# Patient Record
Sex: Male | Born: 1950
Health system: Southern US, Community
[De-identification: ages and names within clinical notes are randomized; demographics above are authoritative.]

## PROBLEM LIST (undated history)

## (undated) DIAGNOSIS — I509 Heart failure, unspecified: Secondary | ICD-10-CM

## (undated) DIAGNOSIS — J449 Chronic obstructive pulmonary disease, unspecified: Secondary | ICD-10-CM

## (undated) DIAGNOSIS — J189 Pneumonia, unspecified organism: Secondary | ICD-10-CM

## (undated) DIAGNOSIS — J939 Pneumothorax, unspecified: Secondary | ICD-10-CM

## (undated) DIAGNOSIS — Z87442 Personal history of urinary calculi: Secondary | ICD-10-CM

## (undated) DIAGNOSIS — R0600 Dyspnea, unspecified: Secondary | ICD-10-CM

## (undated) HISTORY — DX: Chronic obstructive pulmonary disease, unspecified: J44.9

## (undated) HISTORY — DX: Personal history of urinary calculi: Z87.442

## (undated) HISTORY — DX: Pneumothorax, unspecified: J93.9

---

## 2010-08-13 ENCOUNTER — Emergency Department (HOSPITAL_COMMUNITY): Payer: Self-pay

## 2010-08-13 ENCOUNTER — Inpatient Hospital Stay (HOSPITAL_COMMUNITY)
Admission: EM | Admit: 2010-08-13 | Discharge: 2010-08-16 | DRG: 201 | Disposition: A | Discharge status: Home / Self Care | Payer: Self-pay | Attending: Surgery | Admitting: Surgery

## 2010-08-13 DIAGNOSIS — Z7982 Long term (current) use of aspirin: Secondary | ICD-10-CM

## 2010-08-13 DIAGNOSIS — J4489 Other specified chronic obstructive pulmonary disease: Secondary | ICD-10-CM | POA: Diagnosis present

## 2010-08-13 DIAGNOSIS — F17200 Nicotine dependence, unspecified, uncomplicated: Secondary | ICD-10-CM | POA: Diagnosis present

## 2010-08-13 DIAGNOSIS — J449 Chronic obstructive pulmonary disease, unspecified: Secondary | ICD-10-CM | POA: Diagnosis present

## 2010-08-13 DIAGNOSIS — J939 Pneumothorax, unspecified: Secondary | ICD-10-CM

## 2010-08-13 DIAGNOSIS — J9311 Primary spontaneous pneumothorax: Secondary | ICD-10-CM

## 2010-08-13 DIAGNOSIS — J9383 Other pneumothorax: Principal | ICD-10-CM | POA: Diagnosis present

## 2010-08-13 HISTORY — PX: CHEST TUBE INSERTION: SHX231

## 2010-08-13 HISTORY — DX: Pneumothorax, unspecified: J93.9

## 2010-08-13 LAB — DIFFERENTIAL
Lymphocytes Relative: 25 % (ref 12–46)
Monocytes Absolute: 0.9 10*3/uL (ref 0.1–1.0)
Monocytes Relative: 6 % (ref 3–12)
Neutro Abs: 9.9 10*3/uL — ABNORMAL HIGH (ref 1.7–7.7)

## 2010-08-13 LAB — CBC
HCT: 49.1 % (ref 39.0–52.0)
Hemoglobin: 16.1 g/dL (ref 13.0–17.0)
MCH: 29.3 pg (ref 26.0–34.0)
MCHC: 32.8 g/dL (ref 30.0–36.0)

## 2010-08-13 LAB — BASIC METABOLIC PANEL
CO2: 23 mEq/L (ref 19–32)
GFR calc non Af Amer: >60 mL/min (ref >60)
Glucose, Bld: 108 mg/dL — ABNORMAL HIGH (ref 70–99)
Potassium: 4 mEq/L (ref 3.5–5.1)
Sodium: 138 mEq/L (ref 135–145)

## 2010-08-14 ENCOUNTER — Inpatient Hospital Stay (HOSPITAL_COMMUNITY): Payer: Self-pay

## 2010-08-15 ENCOUNTER — Inpatient Hospital Stay (HOSPITAL_COMMUNITY): Payer: Self-pay

## 2010-08-16 ENCOUNTER — Inpatient Hospital Stay (HOSPITAL_COMMUNITY): Payer: Self-pay

## 2010-08-22 ENCOUNTER — Encounter (INDEPENDENT_AMBULATORY_CARE_PROVIDER_SITE_OTHER): Payer: Self-pay

## 2010-08-22 DIAGNOSIS — J9311 Primary spontaneous pneumothorax: Secondary | ICD-10-CM

## 2010-09-01 DIAGNOSIS — J9383 Other pneumothorax: Secondary | ICD-10-CM

## 2010-09-01 DIAGNOSIS — J449 Chronic obstructive pulmonary disease, unspecified: Secondary | ICD-10-CM

## 2010-09-01 DIAGNOSIS — Z87442 Personal history of urinary calculi: Secondary | ICD-10-CM | POA: Insufficient documentation

## 2010-09-05 ENCOUNTER — Other Ambulatory Visit: Payer: Self-pay | Admitting: Surgery

## 2010-09-05 DIAGNOSIS — J930 Spontaneous tension pneumothorax: Secondary | ICD-10-CM

## 2010-09-05 NOTE — Op Note (Signed)
  [name], Victor Cox               ACCOUNT NO.:  [id number]  MEDICAL RECORD NO.:  [id number]  LOCATION:  2011                         FACILITY:  [facility]  PHYSICIAN:  Evelene Croon, M.D.     DATE OF BIRTH:  [birth year]  DATE OF PROCEDURE:  08/13/2010 DATE OF DISCHARGE:                              OPERATIVE REPORT   PREOPERATIVE DIAGNOSIS:  Spontaneous right pneumothorax.  POSTOPERATIVE DIAGNOSIS:  Spontaneous right pneumothorax.  PROCEDURE:  Insertion of right chest tube.  SURGEON:  Evelene Croon, MD  ANESTHESIA:  1% lidocaine local with intravenous sedation.  CLINICAL HISTORY:  This patient is a 60 year old gentleman with heavy smoking history who presents with a large right pneumothorax.  This requires chest tube insertion.  I discussed the operative procedure with the patient and his family including alternatives, benefits, and risks including but not limited to bleeding, infection, injury to the lung, failure to re-expand the lung, and need for further surgery to re-expand the lung or to seal off a persistent air leak.  He understands all this and agrees to proceed.  OPERATIVE PROCEDURE:  Informed consent was obtained from the patient. The procedure was performed in the bed in the emergency room bay.  The right side of the chest was prepped with Betadine solution and draped in usual sterile manner.  Time-out was taken with a nurse and proper patient, proper operation, proper operative side were confirmed.  Then, the patient was given 4 mg of intravenous morphine sulfate.  He was continuously monitored with blood pressure monitoring, pulse oximetry and pulse monitoring.  Then, 1% lidocaine local anesthesia was used to anesthetize the skin and subcutaneous tissue in the right anterolateral chest wall and the anterior axillary line just below the pectoralis muscle.  The anesthesia was infiltrated down to the intercostal space and the pleural space was entered with the needle,  confirming return of air.  Then, a 1-cm incision was made in this location and bluntly dissected down to the intercostal muscle.  The hemostat was used to pop through into the pleural space and there was a large rush of air.  Then, a 20-French trocar chest tube was inserted into the pleural space and trocar slightly withdrawn.  Tube was advanced up to the apex and posteriorly.  The tube was then fixed to the skin with a silk suture and connected to Pleur-Evac suction.  Dry sterile dressing applied around the tube.  Suction was applied to the tube.  There was return of air and air leak seen to stop.  There was improved breath sounds on the right at the end of procedure.  Follow up chest x-ray is pending.     Evelene Croon, M.D.     BB/MEDQ  D:  08/13/2010  T:  08/13/2010  Job:  [id number]  Electronically Signed by Evelene Croon M.D. on 09/05/2010 03:57:06 PM

## 2010-09-05 NOTE — H&P (Signed)
NAMEELISHAH, ASHMORE               ACCOUNT NO.:  [id number]  MEDICAL RECORD NO.:  [id number]  LOCATION:  2011                         FACILITY:  [facility]  PHYSICIAN:  Evelene Croon, M.D.     DATE OF BIRTH:  10/19/1950  DATE OF ADMISSION:  08/13/2010 DATE OF DISCHARGE:                             HISTORY & PHYSICAL   REASON FOR ADMISSION:  Spontaneous right pneumothorax.  CLINICAL HISTORY:  This patient is a 60 year old gentleman with no significant past medical history, except for kidney stones, who has a 40+ pack-year smoking history and developed shortness of breath on Tuesday of this week.  This was not associated with an chest pain.  He did develop some cough productive of clear sputum.  His symptoms persisted all week, and this morning, seemed worse and therefore he presented to the emergency room where chest x-ray showed a large spontaneous right pneumothorax with complete collapse of right lung.  REVIEW OF SYSTEMS:  GENERAL:  He denies any fever or chills.  He denies any recent weight changes.  He has had no change in appetite.  Denies fatigue.  EYES:  Negative.  ENT:  Negative.  ENDOCRINE:  Denies diabetes and hypothyroidism.  CARDIOVASCULAR:  He denies any chest pain or pressure.  He denies orthopnea, PND, but he has had dyspnea at rest and exertional dyspnea this week.  He denies palpitations and edema. RESPIRATORY:  He has had some cough productive of clear sputum.  He has never had pneumonia.  GI:  He denies nausea or vomiting.  Denies melena and bright red blood per rectum.  GU:  Denies dysuria and hematuria. MUSCULOSKELETAL:  Denies arthralgias or myalgias.  NEUROLOGICAL:  He denies any focal weakness or numbness.  Denies dizziness or syncope.  He has never had TIA or stroke.  ALLERGIES:  None.  PSYCHIATRIC:  Negative. HEMATOLOGIC:  Negative.  PAST MEDICAL HISTORY:  Significant for kidney stones.  He denies any other medical or surgical illnesses.  Specifically, he  denies diabetes, hypertension, and hyperlipidemia.  FAMILY HISTORY:  Negative.  He denies any history of cardiac disease or lung cancer in his family.  SOCIAL HISTORY:  He is married.  He has been unemployed since 2007.  He smokes at least one pack of cigarettes per day and has for about 40 years.  He has continued to smoke about a pack a day this weak, even though he was short of breath.  He denies any drug use.  He denies alcohol abuse.  PHYSICAL EXAMINATION:  VITAL SIGNS:  He is afebrile.  Blood pressure is 133/66, pulse is 105 and regular, respiratory rate is 21 and slightly labored, oxygen saturation is 94% on 3 liters nasal cannula. HEENT:  Normocephalic and atraumatic.  Pupils are equal and reactive to light and accommodation.  Extraocular muscles are intact.  Oropharynx is clear. NECK:  Normal carotid pulses bilaterally.  There are no bruits.  There is no adenopathy or thyromegaly. CARDIAC:  Regular rate and rhythm with normal S1 and S2.  There is no murmur, rub, or gallop. LUNGS:  Revealed absent breath sounds on the right and decreased breath sounds on the left.  He has a barrel-shaped chest  consistent with COPD. ABDOMEN:  Active bowel sounds.  His abdomen is soft and nontender. There is no palpable masses or organomegaly. EXTREMITIES:  No peripheral edema.  Pedal pulses are palpable bilaterally. SKIN:  Warm and dry.  LABORATORY EXAMINATION:  Shows normal electrolytes with BUN of 13, creatinine 0.78.  His troponin was less than 0.3.  CPK was 39 with an MB of 2.3.  White blood cell count was elevated at 14.6, hemoglobin 16.1, platelet count 267,000.  IMPRESSION:  Mr. Heitman has a large spontaneous right pneumothorax with complete collapse of the right lung.  He has a heavy smoking history and chronic obstructive pulmonary disease by chest x-ray and on exam.  He will require chest tube insertion and an admission for chest tube management.  I will start him on  bronchodilators and we will also start him on intravenous antibiotics since this has been on since Tuesday and his right lung is completely collapse, increasing his risk of developing pneumonia.  He does have a mildly elevated white blood cell count, but has remained afebrile.  He is coughing up sputum.  I discussed the treatment with him and his family.  He understands and agrees to proceed.     Evelene Croon, M.D.     BB/MEDQ  D:  08/13/2010  T:  08/13/2010  Job:  [id number]  Electronically Signed by Evelene Croon M.D. on 09/05/2010 03:57:03 PM

## 2010-09-05 NOTE — Discharge Summary (Signed)
Victor Cox, Victor Cox               ACCOUNT NO.:  [id number]  MEDICAL RECORD NO.:  [id number]  LOCATION:  2011                         FACILITY:  [facility]  PHYSICIAN:  Evelene Croon, M.D.     DATE OF BIRTH:  12/13/1950  DATE OF ADMISSION:  08/13/2010 DATE OF DISCHARGE:                              DISCHARGE SUMMARY   FINAL DIAGNOSIS:  Spontaneous right pneumothorax.  SECONDARY DIAGNOSIS:  History of nephrolithiasis.  IN-HOSPITAL OPERATIONS AND PROCEDURES:  Insertion of right chest tube, 20-French done by Dr. Laneta Simmers August 13, 2010.  PATIENT'S HISTORY AND PHYSICAL AND HOSPITAL COURSE:  The patient is a 60- year-old gentleman with no significant past medical history except for kidney stones.  He has a 40+ year pack year smoking history and developed shortness of breath on Tuesday, August 09, 2010.  He did develop some cough productive of clear sputum.  His symptoms persisted all week and on August 13, 2010, symptoms increased in severity.  He presented to the emergency room, where chest x-ray obtained showed a large spontaneous right pneumothorax with complete collapse of right lung. Dr. Laneta Simmers was consulted.  Dr. Laneta Simmers saw and evaluated the patient.  He discussed with the patient placement of right chest tube as well as admission to [facility].  For further details of the patient's past medical history and physical exam, please see dictated H and P.  Dr. Laneta Simmers discussed with the patient insertion of right chest tube for a spontaneous right pneumothorax.  He discussed risks and benefits of placing this chest tube.  The patient voiced understanding and agreed to proceed.  Plan was to insert chest tube in the emergency room prior to admission.  Dr. Laneta Simmers inserted a 20-French right chest tube without difficulty on August 13, 2010.  Followup chest x-ray showed decrease in the patient's right pneumothorax with only trace amount of anterior pleural air at the base.  Following insertion  of chest tube, the patient was admitted to [facility] on August 13, 2010.  Daily chest x- rays were obtained.  Chest x-ray on August 14, 2010, showed no pneumothorax.  The patient had no air leak noted.  Chest tube was placed to water seal.  Following day August 15, 2010, chest x-ray noted to be stable with no pneumothorax.  There is no air leak noted.  Chest tube was discontinued.  Followup chest x-ray following removal of the chest is currently pending.  I will plan to obtain PMI chest x-ray in the a.m. for further followup as well.  During that time, the patient was on 2-3 L of nasal cannula.  Currently, the patient remains on 3 L nasal cannula and morphine to wean off oxygen to maintain O2 sats greater than 90% on room air.  If unable to be weaned off oxygen, the patient may require home O2.  During the patient's hospital course, vital signs followed closely.  He has remained afebrile in normal sinus rhythm.  Blood pressure stable.  He has been up ambulating well without difficulty.  He is tolerating diet well.  No nausea, vomiting noted.  All incisions are clean, dry and intact and healing well.  Lab work on March 15, 2010, showed a D-dimer of 0.64.  Sodium of 138, potassium 4.0, chloride of 105, bicarbonate 24, BUN of 13, creatinine 0.78 and glucose 108.  White blood cell count 14.6, hemoglobin 16.1, hematocrit 49.1 and platelet count of 267.  The patient is tentatively ready for discharge to home in the a.m. August 16, 2010, pending his followup PMI.  Chest x-ray remained stable without pneumothorax.  Also, we will follow up on oxygen requirement.  FOLLOWUP APPOINTMENTS:  Followup appointment has arranged with Dr. Laneta Simmers for September 06, 2010, at 3:15 p.m.  The patient will need to obtain PMI chest x-ray 45 minutes prior to this appointment.  Suture removal appointment has been made with a nurse for August 22, 2010, at 9 o'clock a.m.  ACTIVITY:  The patient is instructed no  driving, he agrees to do so, no lifting over greater than 10 pounds.  He is told to ambulate 3-4 times per day, progress as tolerated and continue his breathing exercises.  INCISIONAL CARE:  The patient is told to shower, washing his incisions using soap and water.  He is to contact the office if he develops any drainage or opening from any of his incision sites.  DIET:  The patient can diet to be low-fat, low-salt.  DISCHARGE MEDICATIONS: 1. Guaifenesin 600 mg b.i.d. p.r.n. 2. Combivent inhaler 2 puffs 4 times daily. 3. Nicotine patch 21 mg for 24 hours daily. 4. Oxycodone 5 mg 1-2 tabs q.3 h. p.r.n. pain. 5. Aspirin 325 mg daily.     Sol Blazing, PA   ______________________________ Evelene Croon, M.D.    KMD/MEDQ  D:  08/15/2010  T:  08/16/2010  Job:  [id number]  cc:   Evelene Croon, M.D.  Electronically Signed by Cameron Proud PA on 08/17/2010 09:42:34 AM Electronically Signed by Evelene Croon M.D. on 09/05/2010 03:57:01 PM

## 2010-09-06 ENCOUNTER — Ambulatory Visit (INDEPENDENT_AMBULATORY_CARE_PROVIDER_SITE_OTHER): Payer: Self-pay | Admitting: Surgery

## 2010-09-06 ENCOUNTER — Encounter: Payer: Self-pay | Admitting: Surgery

## 2010-09-06 ENCOUNTER — Ambulatory Visit
Admission: RE | Admit: 2010-09-06 | Discharge: 2010-09-06 | Disposition: A | Discharge status: Home / Self Care | Payer: Self-pay | Source: Ambulatory Visit | Attending: Surgery | Admitting: Surgery

## 2010-09-06 VITALS — BP 126/72 | HR 90 | Resp 18

## 2010-09-06 DIAGNOSIS — Z09 Encounter for follow-up examination after completed treatment for conditions other than malignant neoplasm: Secondary | ICD-10-CM

## 2010-09-06 DIAGNOSIS — Z9889 Other specified postprocedural states: Secondary | ICD-10-CM

## 2010-09-06 DIAGNOSIS — J930 Spontaneous tension pneumothorax: Secondary | ICD-10-CM

## 2010-09-06 DIAGNOSIS — J9383 Other pneumothorax: Secondary | ICD-10-CM

## 2010-09-06 DIAGNOSIS — J939 Pneumothorax, unspecified: Secondary | ICD-10-CM

## 2010-09-06 NOTE — Progress Notes (Signed)
  HPI  Patient returns for routine postoperative follow-up having undergone insertion of a right chest tube for spontaneous right pneumothorax on 08/13/2010. The patient's early postoperative recovery while in the hospital was notable for no residual air leak. Since hospital discharge the patient reports he has been feeling well. He says he has continued to abstain from smoking since hospitalization. He denies any chest pain or shortness of breath..    Current outpatient prescriptions:albuterol (PROVENTIL) 2 MG tablet, Take 2 mg by mouth 3 (three) times daily.  , Disp: , Rfl: ;  aspirin 325 MG tablet, Take 325 mg by mouth daily.  , Disp: , Rfl: ;  guaiFENesin (MUCINEX) 600 MG 12 hr tablet, Take 1,200 mg by mouth 2 (two) times daily.  , Disp: , Rfl:     Physical Exam   Diagnostic tests:  Chest x-ray today shows clear lung fields and no pleural effusions. There is no pneumothorax.  Impression:  Status post insertion of right chest tube for spontaneous right pneumothorax  Plan:  He will return to my office or the emergency room if he develops recurrent acute shortness of breath or chest pain similar to what he had before.

## 2010-09-06 NOTE — Patient Instructions (Signed)
No smoking Return to see me or go to the emergency room if you develop acute shortness of breath or chest pain.

## 2015-03-09 DIAGNOSIS — I10 Essential (primary) hypertension: Secondary | ICD-10-CM | POA: Diagnosis not present

## 2015-03-09 DIAGNOSIS — I5022 Chronic systolic (congestive) heart failure: Secondary | ICD-10-CM | POA: Diagnosis not present

## 2015-04-20 DIAGNOSIS — Z720 Tobacco use: Secondary | ICD-10-CM | POA: Diagnosis not present

## 2015-04-20 DIAGNOSIS — I5022 Chronic systolic (congestive) heart failure: Secondary | ICD-10-CM | POA: Diagnosis not present

## 2015-06-28 DIAGNOSIS — I5021 Acute systolic (congestive) heart failure: Secondary | ICD-10-CM | POA: Diagnosis not present

## 2015-06-28 DIAGNOSIS — I5022 Chronic systolic (congestive) heart failure: Secondary | ICD-10-CM | POA: Diagnosis not present

## 2015-12-27 DIAGNOSIS — Z720 Tobacco use: Secondary | ICD-10-CM | POA: Diagnosis not present

## 2015-12-27 DIAGNOSIS — I5022 Chronic systolic (congestive) heart failure: Secondary | ICD-10-CM | POA: Diagnosis not present

## 2016-06-28 DIAGNOSIS — I517 Cardiomegaly: Secondary | ICD-10-CM | POA: Diagnosis not present

## 2016-06-28 DIAGNOSIS — I5022 Chronic systolic (congestive) heart failure: Secondary | ICD-10-CM | POA: Diagnosis not present

## 2016-06-28 DIAGNOSIS — I080 Rheumatic disorders of both mitral and aortic valves: Secondary | ICD-10-CM | POA: Diagnosis not present

## 2017-01-20 DIAGNOSIS — J441 Chronic obstructive pulmonary disease with (acute) exacerbation: Secondary | ICD-10-CM | POA: Diagnosis not present

## 2017-01-20 DIAGNOSIS — I110 Hypertensive heart disease with heart failure: Secondary | ICD-10-CM | POA: Diagnosis not present

## 2017-01-20 DIAGNOSIS — I429 Cardiomyopathy, unspecified: Secondary | ICD-10-CM | POA: Diagnosis not present

## 2017-01-20 DIAGNOSIS — I5022 Chronic systolic (congestive) heart failure: Secondary | ICD-10-CM | POA: Diagnosis not present

## 2017-01-20 DIAGNOSIS — R0602 Shortness of breath: Secondary | ICD-10-CM | POA: Diagnosis not present

## 2017-01-20 DIAGNOSIS — F17200 Nicotine dependence, unspecified, uncomplicated: Secondary | ICD-10-CM | POA: Diagnosis not present

## 2017-01-20 DIAGNOSIS — J449 Chronic obstructive pulmonary disease, unspecified: Secondary | ICD-10-CM | POA: Diagnosis not present

## 2017-01-20 DIAGNOSIS — R0902 Hypoxemia: Secondary | ICD-10-CM | POA: Diagnosis not present

## 2017-01-20 DIAGNOSIS — Z720 Tobacco use: Secondary | ICD-10-CM | POA: Diagnosis not present

## 2017-01-22 DIAGNOSIS — Z720 Tobacco use: Secondary | ICD-10-CM | POA: Diagnosis not present

## 2017-01-22 DIAGNOSIS — R0602 Shortness of breath: Secondary | ICD-10-CM | POA: Diagnosis not present

## 2017-01-22 DIAGNOSIS — J441 Chronic obstructive pulmonary disease with (acute) exacerbation: Secondary | ICD-10-CM | POA: Diagnosis not present

## 2017-01-23 DIAGNOSIS — I6523 Occlusion and stenosis of bilateral carotid arteries: Secondary | ICD-10-CM | POA: Diagnosis not present

## 2017-01-23 DIAGNOSIS — Z79899 Other long term (current) drug therapy: Secondary | ICD-10-CM | POA: Diagnosis not present

## 2017-01-23 DIAGNOSIS — J441 Chronic obstructive pulmonary disease with (acute) exacerbation: Secondary | ICD-10-CM | POA: Diagnosis present

## 2017-01-23 DIAGNOSIS — I110 Hypertensive heart disease with heart failure: Secondary | ICD-10-CM | POA: Diagnosis present

## 2017-01-23 DIAGNOSIS — R0989 Other specified symptoms and signs involving the circulatory and respiratory systems: Secondary | ICD-10-CM | POA: Diagnosis present

## 2017-01-23 DIAGNOSIS — I429 Cardiomyopathy, unspecified: Secondary | ICD-10-CM | POA: Diagnosis present

## 2017-01-23 DIAGNOSIS — I5022 Chronic systolic (congestive) heart failure: Secondary | ICD-10-CM | POA: Diagnosis present

## 2017-01-23 DIAGNOSIS — R0902 Hypoxemia: Secondary | ICD-10-CM | POA: Diagnosis present

## 2017-01-23 DIAGNOSIS — F17200 Nicotine dependence, unspecified, uncomplicated: Secondary | ICD-10-CM | POA: Diagnosis present

## 2017-02-05 DIAGNOSIS — J449 Chronic obstructive pulmonary disease, unspecified: Secondary | ICD-10-CM | POA: Diagnosis not present

## 2017-02-05 DIAGNOSIS — I429 Cardiomyopathy, unspecified: Secondary | ICD-10-CM | POA: Diagnosis not present

## 2017-02-05 DIAGNOSIS — N200 Calculus of kidney: Secondary | ICD-10-CM | POA: Diagnosis not present

## 2017-02-05 DIAGNOSIS — K8020 Calculus of gallbladder without cholecystitis without obstruction: Secondary | ICD-10-CM | POA: Diagnosis not present

## 2017-02-05 DIAGNOSIS — Z299 Encounter for prophylactic measures, unspecified: Secondary | ICD-10-CM | POA: Diagnosis not present

## 2017-02-05 DIAGNOSIS — I10 Essential (primary) hypertension: Secondary | ICD-10-CM | POA: Diagnosis not present

## 2017-02-05 DIAGNOSIS — I6529 Occlusion and stenosis of unspecified carotid artery: Secondary | ICD-10-CM | POA: Diagnosis not present

## 2017-02-05 DIAGNOSIS — Z6829 Body mass index (BMI) 29.0-29.9, adult: Secondary | ICD-10-CM | POA: Diagnosis not present

## 2017-02-05 DIAGNOSIS — Z87891 Personal history of nicotine dependence: Secondary | ICD-10-CM | POA: Diagnosis not present

## 2017-03-09 DIAGNOSIS — Z6828 Body mass index (BMI) 28.0-28.9, adult: Secondary | ICD-10-CM | POA: Diagnosis not present

## 2017-03-09 DIAGNOSIS — Z1339 Encounter for screening examination for other mental health and behavioral disorders: Secondary | ICD-10-CM | POA: Diagnosis not present

## 2017-03-09 DIAGNOSIS — Z1331 Encounter for screening for depression: Secondary | ICD-10-CM | POA: Diagnosis not present

## 2017-03-09 DIAGNOSIS — Z125 Encounter for screening for malignant neoplasm of prostate: Secondary | ICD-10-CM | POA: Diagnosis not present

## 2017-03-09 DIAGNOSIS — Z7189 Other specified counseling: Secondary | ICD-10-CM | POA: Diagnosis not present

## 2017-03-09 DIAGNOSIS — I429 Cardiomyopathy, unspecified: Secondary | ICD-10-CM | POA: Diagnosis not present

## 2017-03-09 DIAGNOSIS — I10 Essential (primary) hypertension: Secondary | ICD-10-CM | POA: Diagnosis not present

## 2017-03-09 DIAGNOSIS — Z299 Encounter for prophylactic measures, unspecified: Secondary | ICD-10-CM | POA: Diagnosis not present

## 2017-03-09 DIAGNOSIS — Z0000 Encounter for general adult medical examination without abnormal findings: Secondary | ICD-10-CM | POA: Diagnosis not present

## 2017-03-09 DIAGNOSIS — J449 Chronic obstructive pulmonary disease, unspecified: Secondary | ICD-10-CM | POA: Diagnosis not present

## 2017-03-09 DIAGNOSIS — Z1211 Encounter for screening for malignant neoplasm of colon: Secondary | ICD-10-CM | POA: Diagnosis not present

## 2017-03-09 DIAGNOSIS — Z79899 Other long term (current) drug therapy: Secondary | ICD-10-CM | POA: Diagnosis not present

## 2017-03-09 DIAGNOSIS — I6529 Occlusion and stenosis of unspecified carotid artery: Secondary | ICD-10-CM | POA: Diagnosis not present

## 2017-03-14 DIAGNOSIS — R5383 Other fatigue: Secondary | ICD-10-CM | POA: Diagnosis not present

## 2017-04-12 DIAGNOSIS — R5383 Other fatigue: Secondary | ICD-10-CM | POA: Diagnosis not present

## 2017-05-07 DIAGNOSIS — I10 Essential (primary) hypertension: Secondary | ICD-10-CM | POA: Diagnosis not present

## 2017-05-07 DIAGNOSIS — Z6828 Body mass index (BMI) 28.0-28.9, adult: Secondary | ICD-10-CM | POA: Diagnosis not present

## 2017-05-07 DIAGNOSIS — Z299 Encounter for prophylactic measures, unspecified: Secondary | ICD-10-CM | POA: Diagnosis not present

## 2017-05-07 DIAGNOSIS — I429 Cardiomyopathy, unspecified: Secondary | ICD-10-CM | POA: Diagnosis not present

## 2017-05-07 DIAGNOSIS — J449 Chronic obstructive pulmonary disease, unspecified: Secondary | ICD-10-CM | POA: Diagnosis not present

## 2017-08-06 DIAGNOSIS — Z6828 Body mass index (BMI) 28.0-28.9, adult: Secondary | ICD-10-CM | POA: Diagnosis not present

## 2017-08-06 DIAGNOSIS — J449 Chronic obstructive pulmonary disease, unspecified: Secondary | ICD-10-CM | POA: Diagnosis not present

## 2017-08-06 DIAGNOSIS — I6529 Occlusion and stenosis of unspecified carotid artery: Secondary | ICD-10-CM | POA: Diagnosis not present

## 2017-08-06 DIAGNOSIS — I10 Essential (primary) hypertension: Secondary | ICD-10-CM | POA: Diagnosis not present

## 2017-08-06 DIAGNOSIS — I429 Cardiomyopathy, unspecified: Secondary | ICD-10-CM | POA: Diagnosis not present

## 2017-08-06 DIAGNOSIS — Z299 Encounter for prophylactic measures, unspecified: Secondary | ICD-10-CM | POA: Diagnosis not present

## 2017-10-09 DIAGNOSIS — I429 Cardiomyopathy, unspecified: Secondary | ICD-10-CM | POA: Diagnosis not present

## 2017-10-09 DIAGNOSIS — N529 Male erectile dysfunction, unspecified: Secondary | ICD-10-CM | POA: Diagnosis not present

## 2017-10-09 DIAGNOSIS — Z299 Encounter for prophylactic measures, unspecified: Secondary | ICD-10-CM | POA: Diagnosis not present

## 2017-10-09 DIAGNOSIS — I10 Essential (primary) hypertension: Secondary | ICD-10-CM | POA: Diagnosis not present

## 2017-10-09 DIAGNOSIS — Z6828 Body mass index (BMI) 28.0-28.9, adult: Secondary | ICD-10-CM | POA: Diagnosis not present

## 2017-10-09 DIAGNOSIS — J449 Chronic obstructive pulmonary disease, unspecified: Secondary | ICD-10-CM | POA: Diagnosis not present

## 2017-11-06 DIAGNOSIS — I10 Essential (primary) hypertension: Secondary | ICD-10-CM | POA: Diagnosis not present

## 2017-11-06 DIAGNOSIS — Z6828 Body mass index (BMI) 28.0-28.9, adult: Secondary | ICD-10-CM | POA: Diagnosis not present

## 2017-11-06 DIAGNOSIS — Z713 Dietary counseling and surveillance: Secondary | ICD-10-CM | POA: Diagnosis not present

## 2017-11-06 DIAGNOSIS — J449 Chronic obstructive pulmonary disease, unspecified: Secondary | ICD-10-CM | POA: Diagnosis not present

## 2017-11-06 DIAGNOSIS — Z299 Encounter for prophylactic measures, unspecified: Secondary | ICD-10-CM | POA: Diagnosis not present

## 2017-11-22 DIAGNOSIS — R05 Cough: Secondary | ICD-10-CM | POA: Diagnosis not present

## 2017-11-22 DIAGNOSIS — R0602 Shortness of breath: Secondary | ICD-10-CM | POA: Diagnosis not present

## 2017-12-18 DIAGNOSIS — J449 Chronic obstructive pulmonary disease, unspecified: Secondary | ICD-10-CM | POA: Diagnosis not present

## 2017-12-18 DIAGNOSIS — Z299 Encounter for prophylactic measures, unspecified: Secondary | ICD-10-CM | POA: Diagnosis not present

## 2017-12-18 DIAGNOSIS — I10 Essential (primary) hypertension: Secondary | ICD-10-CM | POA: Diagnosis not present

## 2017-12-18 DIAGNOSIS — I429 Cardiomyopathy, unspecified: Secondary | ICD-10-CM | POA: Diagnosis not present

## 2017-12-18 DIAGNOSIS — Z6838 Body mass index (BMI) 38.0-38.9, adult: Secondary | ICD-10-CM | POA: Diagnosis not present

## 2018-03-11 DIAGNOSIS — Z299 Encounter for prophylactic measures, unspecified: Secondary | ICD-10-CM | POA: Diagnosis not present

## 2018-03-11 DIAGNOSIS — Z1331 Encounter for screening for depression: Secondary | ICD-10-CM | POA: Diagnosis not present

## 2018-03-11 DIAGNOSIS — Z125 Encounter for screening for malignant neoplasm of prostate: Secondary | ICD-10-CM | POA: Diagnosis not present

## 2018-03-11 DIAGNOSIS — F17210 Nicotine dependence, cigarettes, uncomplicated: Secondary | ICD-10-CM | POA: Diagnosis not present

## 2018-03-11 DIAGNOSIS — I10 Essential (primary) hypertension: Secondary | ICD-10-CM | POA: Diagnosis not present

## 2018-03-11 DIAGNOSIS — Z7189 Other specified counseling: Secondary | ICD-10-CM | POA: Diagnosis not present

## 2018-03-11 DIAGNOSIS — I429 Cardiomyopathy, unspecified: Secondary | ICD-10-CM | POA: Diagnosis not present

## 2018-03-11 DIAGNOSIS — R5383 Other fatigue: Secondary | ICD-10-CM | POA: Diagnosis not present

## 2018-03-11 DIAGNOSIS — Z1339 Encounter for screening examination for other mental health and behavioral disorders: Secondary | ICD-10-CM | POA: Diagnosis not present

## 2018-03-11 DIAGNOSIS — Z0000 Encounter for general adult medical examination without abnormal findings: Secondary | ICD-10-CM | POA: Diagnosis not present

## 2018-03-11 DIAGNOSIS — Z6828 Body mass index (BMI) 28.0-28.9, adult: Secondary | ICD-10-CM | POA: Diagnosis not present

## 2018-03-11 DIAGNOSIS — Z79899 Other long term (current) drug therapy: Secondary | ICD-10-CM | POA: Diagnosis not present

## 2018-03-11 DIAGNOSIS — Z1211 Encounter for screening for malignant neoplasm of colon: Secondary | ICD-10-CM | POA: Diagnosis not present

## 2018-03-11 DIAGNOSIS — I6529 Occlusion and stenosis of unspecified carotid artery: Secondary | ICD-10-CM | POA: Diagnosis not present

## 2018-06-11 DIAGNOSIS — I429 Cardiomyopathy, unspecified: Secondary | ICD-10-CM | POA: Diagnosis not present

## 2018-06-11 DIAGNOSIS — Z6828 Body mass index (BMI) 28.0-28.9, adult: Secondary | ICD-10-CM | POA: Diagnosis not present

## 2018-06-11 DIAGNOSIS — J449 Chronic obstructive pulmonary disease, unspecified: Secondary | ICD-10-CM | POA: Diagnosis not present

## 2018-06-11 DIAGNOSIS — Z299 Encounter for prophylactic measures, unspecified: Secondary | ICD-10-CM | POA: Diagnosis not present

## 2018-06-11 DIAGNOSIS — I10 Essential (primary) hypertension: Secondary | ICD-10-CM | POA: Diagnosis not present

## 2018-08-05 DIAGNOSIS — Z8701 Personal history of pneumonia (recurrent): Secondary | ICD-10-CM | POA: Diagnosis not present

## 2018-08-05 DIAGNOSIS — J441 Chronic obstructive pulmonary disease with (acute) exacerbation: Secondary | ICD-10-CM | POA: Diagnosis not present

## 2018-08-05 DIAGNOSIS — Z720 Tobacco use: Secondary | ICD-10-CM | POA: Diagnosis not present

## 2018-08-05 DIAGNOSIS — J40 Bronchitis, not specified as acute or chronic: Secondary | ICD-10-CM | POA: Diagnosis not present

## 2018-08-05 DIAGNOSIS — Z20828 Contact with and (suspected) exposure to other viral communicable diseases: Secondary | ICD-10-CM | POA: Diagnosis not present

## 2018-08-05 DIAGNOSIS — R0602 Shortness of breath: Secondary | ICD-10-CM | POA: Diagnosis not present

## 2018-08-05 DIAGNOSIS — I509 Heart failure, unspecified: Secondary | ICD-10-CM | POA: Diagnosis not present

## 2018-08-05 DIAGNOSIS — F17200 Nicotine dependence, unspecified, uncomplicated: Secondary | ICD-10-CM | POA: Diagnosis not present

## 2018-08-13 DIAGNOSIS — J449 Chronic obstructive pulmonary disease, unspecified: Secondary | ICD-10-CM | POA: Diagnosis not present

## 2018-08-13 DIAGNOSIS — J069 Acute upper respiratory infection, unspecified: Secondary | ICD-10-CM | POA: Diagnosis not present

## 2018-08-13 DIAGNOSIS — H6981 Other specified disorders of Eustachian tube, right ear: Secondary | ICD-10-CM | POA: Diagnosis not present

## 2018-08-13 DIAGNOSIS — Z299 Encounter for prophylactic measures, unspecified: Secondary | ICD-10-CM | POA: Diagnosis not present

## 2018-08-13 DIAGNOSIS — R600 Localized edema: Secondary | ICD-10-CM | POA: Diagnosis not present

## 2018-08-13 DIAGNOSIS — Z6828 Body mass index (BMI) 28.0-28.9, adult: Secondary | ICD-10-CM | POA: Diagnosis not present

## 2018-09-13 DIAGNOSIS — Z299 Encounter for prophylactic measures, unspecified: Secondary | ICD-10-CM | POA: Diagnosis not present

## 2018-09-13 DIAGNOSIS — I10 Essential (primary) hypertension: Secondary | ICD-10-CM | POA: Diagnosis not present

## 2018-09-13 DIAGNOSIS — Z6826 Body mass index (BMI) 26.0-26.9, adult: Secondary | ICD-10-CM | POA: Diagnosis not present

## 2018-09-13 DIAGNOSIS — I429 Cardiomyopathy, unspecified: Secondary | ICD-10-CM | POA: Diagnosis not present

## 2018-09-13 DIAGNOSIS — J449 Chronic obstructive pulmonary disease, unspecified: Secondary | ICD-10-CM | POA: Diagnosis not present

## 2018-09-13 DIAGNOSIS — G4700 Insomnia, unspecified: Secondary | ICD-10-CM | POA: Diagnosis not present

## 2018-09-26 DIAGNOSIS — J449 Chronic obstructive pulmonary disease, unspecified: Secondary | ICD-10-CM | POA: Diagnosis not present

## 2018-09-26 DIAGNOSIS — I10 Essential (primary) hypertension: Secondary | ICD-10-CM | POA: Diagnosis not present

## 2019-01-16 DIAGNOSIS — I10 Essential (primary) hypertension: Secondary | ICD-10-CM | POA: Diagnosis not present

## 2019-01-16 DIAGNOSIS — J449 Chronic obstructive pulmonary disease, unspecified: Secondary | ICD-10-CM | POA: Diagnosis not present

## 2019-01-30 DIAGNOSIS — J449 Chronic obstructive pulmonary disease, unspecified: Secondary | ICD-10-CM | POA: Diagnosis not present

## 2019-01-30 DIAGNOSIS — I10 Essential (primary) hypertension: Secondary | ICD-10-CM | POA: Diagnosis not present

## 2019-03-06 DIAGNOSIS — I10 Essential (primary) hypertension: Secondary | ICD-10-CM | POA: Diagnosis not present

## 2019-03-06 DIAGNOSIS — J449 Chronic obstructive pulmonary disease, unspecified: Secondary | ICD-10-CM | POA: Diagnosis not present

## 2019-03-17 DIAGNOSIS — Z6826 Body mass index (BMI) 26.0-26.9, adult: Secondary | ICD-10-CM | POA: Diagnosis not present

## 2019-03-17 DIAGNOSIS — Z1331 Encounter for screening for depression: Secondary | ICD-10-CM | POA: Diagnosis not present

## 2019-03-17 DIAGNOSIS — Z0000 Encounter for general adult medical examination without abnormal findings: Secondary | ICD-10-CM | POA: Diagnosis not present

## 2019-03-17 DIAGNOSIS — Z299 Encounter for prophylactic measures, unspecified: Secondary | ICD-10-CM | POA: Diagnosis not present

## 2019-03-17 DIAGNOSIS — I10 Essential (primary) hypertension: Secondary | ICD-10-CM | POA: Diagnosis not present

## 2019-03-17 DIAGNOSIS — F17210 Nicotine dependence, cigarettes, uncomplicated: Secondary | ICD-10-CM | POA: Diagnosis not present

## 2019-03-17 DIAGNOSIS — Z125 Encounter for screening for malignant neoplasm of prostate: Secondary | ICD-10-CM | POA: Diagnosis not present

## 2019-03-17 DIAGNOSIS — R5383 Other fatigue: Secondary | ICD-10-CM | POA: Diagnosis not present

## 2019-03-17 DIAGNOSIS — Z79899 Other long term (current) drug therapy: Secondary | ICD-10-CM | POA: Diagnosis not present

## 2019-03-17 DIAGNOSIS — J449 Chronic obstructive pulmonary disease, unspecified: Secondary | ICD-10-CM | POA: Diagnosis not present

## 2019-03-17 DIAGNOSIS — Z7189 Other specified counseling: Secondary | ICD-10-CM | POA: Diagnosis not present

## 2019-03-17 DIAGNOSIS — Z1211 Encounter for screening for malignant neoplasm of colon: Secondary | ICD-10-CM | POA: Diagnosis not present

## 2019-03-17 DIAGNOSIS — Z1339 Encounter for screening examination for other mental health and behavioral disorders: Secondary | ICD-10-CM | POA: Diagnosis not present

## 2019-03-31 DIAGNOSIS — J449 Chronic obstructive pulmonary disease, unspecified: Secondary | ICD-10-CM | POA: Diagnosis not present

## 2019-03-31 DIAGNOSIS — I10 Essential (primary) hypertension: Secondary | ICD-10-CM | POA: Diagnosis not present

## 2019-04-23 DIAGNOSIS — Z23 Encounter for immunization: Secondary | ICD-10-CM | POA: Diagnosis not present

## 2019-05-07 DIAGNOSIS — I10 Essential (primary) hypertension: Secondary | ICD-10-CM | POA: Diagnosis not present

## 2019-05-07 DIAGNOSIS — J449 Chronic obstructive pulmonary disease, unspecified: Secondary | ICD-10-CM | POA: Diagnosis not present

## 2019-05-20 DIAGNOSIS — Z23 Encounter for immunization: Secondary | ICD-10-CM | POA: Diagnosis not present

## 2019-06-15 DIAGNOSIS — I10 Essential (primary) hypertension: Secondary | ICD-10-CM | POA: Diagnosis not present

## 2019-06-15 DIAGNOSIS — J449 Chronic obstructive pulmonary disease, unspecified: Secondary | ICD-10-CM | POA: Diagnosis not present

## 2019-06-25 DIAGNOSIS — D692 Other nonthrombocytopenic purpura: Secondary | ICD-10-CM | POA: Diagnosis not present

## 2019-06-25 DIAGNOSIS — J449 Chronic obstructive pulmonary disease, unspecified: Secondary | ICD-10-CM | POA: Diagnosis not present

## 2019-06-25 DIAGNOSIS — Z299 Encounter for prophylactic measures, unspecified: Secondary | ICD-10-CM | POA: Diagnosis not present

## 2019-06-25 DIAGNOSIS — I429 Cardiomyopathy, unspecified: Secondary | ICD-10-CM | POA: Diagnosis not present

## 2019-06-25 DIAGNOSIS — I10 Essential (primary) hypertension: Secondary | ICD-10-CM | POA: Diagnosis not present

## 2019-07-16 DIAGNOSIS — J449 Chronic obstructive pulmonary disease, unspecified: Secondary | ICD-10-CM | POA: Diagnosis not present

## 2019-07-16 DIAGNOSIS — I10 Essential (primary) hypertension: Secondary | ICD-10-CM | POA: Diagnosis not present

## 2019-08-15 DIAGNOSIS — I10 Essential (primary) hypertension: Secondary | ICD-10-CM | POA: Diagnosis not present

## 2019-08-15 DIAGNOSIS — J449 Chronic obstructive pulmonary disease, unspecified: Secondary | ICD-10-CM | POA: Diagnosis not present

## 2019-09-04 DIAGNOSIS — J449 Chronic obstructive pulmonary disease, unspecified: Secondary | ICD-10-CM | POA: Diagnosis not present

## 2019-09-04 DIAGNOSIS — I10 Essential (primary) hypertension: Secondary | ICD-10-CM | POA: Diagnosis not present

## 2019-10-03 DIAGNOSIS — J9611 Chronic respiratory failure with hypoxia: Secondary | ICD-10-CM | POA: Diagnosis not present

## 2019-10-03 DIAGNOSIS — I429 Cardiomyopathy, unspecified: Secondary | ICD-10-CM | POA: Diagnosis not present

## 2019-10-03 DIAGNOSIS — I10 Essential (primary) hypertension: Secondary | ICD-10-CM | POA: Diagnosis not present

## 2019-10-03 DIAGNOSIS — Z299 Encounter for prophylactic measures, unspecified: Secondary | ICD-10-CM | POA: Diagnosis not present

## 2019-10-03 DIAGNOSIS — J449 Chronic obstructive pulmonary disease, unspecified: Secondary | ICD-10-CM | POA: Diagnosis not present

## 2019-10-16 DIAGNOSIS — I10 Essential (primary) hypertension: Secondary | ICD-10-CM | POA: Diagnosis not present

## 2019-10-16 DIAGNOSIS — J449 Chronic obstructive pulmonary disease, unspecified: Secondary | ICD-10-CM | POA: Diagnosis not present

## 2019-10-20 DIAGNOSIS — I6523 Occlusion and stenosis of bilateral carotid arteries: Secondary | ICD-10-CM | POA: Diagnosis not present

## 2019-10-20 DIAGNOSIS — I6529 Occlusion and stenosis of unspecified carotid artery: Secondary | ICD-10-CM | POA: Diagnosis not present

## 2019-10-23 DIAGNOSIS — J449 Chronic obstructive pulmonary disease, unspecified: Secondary | ICD-10-CM | POA: Diagnosis not present

## 2019-10-23 DIAGNOSIS — F17210 Nicotine dependence, cigarettes, uncomplicated: Secondary | ICD-10-CM | POA: Diagnosis not present

## 2019-10-23 DIAGNOSIS — I429 Cardiomyopathy, unspecified: Secondary | ICD-10-CM | POA: Diagnosis not present

## 2019-10-23 DIAGNOSIS — I10 Essential (primary) hypertension: Secondary | ICD-10-CM | POA: Diagnosis not present

## 2019-10-23 DIAGNOSIS — I779 Disorder of arteries and arterioles, unspecified: Secondary | ICD-10-CM | POA: Diagnosis not present

## 2019-10-23 DIAGNOSIS — Z299 Encounter for prophylactic measures, unspecified: Secondary | ICD-10-CM | POA: Diagnosis not present

## 2019-11-04 ENCOUNTER — Other Ambulatory Visit: Payer: Self-pay

## 2019-11-04 DIAGNOSIS — I6523 Occlusion and stenosis of bilateral carotid arteries: Secondary | ICD-10-CM

## 2019-11-12 ENCOUNTER — Other Ambulatory Visit: Payer: Self-pay

## 2019-11-12 ENCOUNTER — Ambulatory Visit (HOSPITAL_COMMUNITY)
Admission: RE | Admit: 2019-11-12 | Discharge: 2019-11-12 | Disposition: A | Discharge status: Home / Self Care | Payer: Medicare Other | Source: Ambulatory Visit | Attending: Vascular Surgery | Admitting: Vascular Surgery

## 2019-11-12 DIAGNOSIS — I6523 Occlusion and stenosis of bilateral carotid arteries: Secondary | ICD-10-CM | POA: Diagnosis not present

## 2019-11-14 DIAGNOSIS — J449 Chronic obstructive pulmonary disease, unspecified: Secondary | ICD-10-CM | POA: Diagnosis not present

## 2019-11-14 DIAGNOSIS — I10 Essential (primary) hypertension: Secondary | ICD-10-CM | POA: Diagnosis not present

## 2019-11-17 ENCOUNTER — Encounter: Payer: Self-pay | Admitting: Vascular Surgery

## 2019-11-17 ENCOUNTER — Ambulatory Visit (INDEPENDENT_AMBULATORY_CARE_PROVIDER_SITE_OTHER): Payer: Medicare Other | Admitting: Vascular Surgery

## 2019-11-17 ENCOUNTER — Other Ambulatory Visit: Payer: Self-pay

## 2019-11-17 VITALS — BP 121/71 | HR 83 | Temp 97.5°F | Resp 18 | Ht 70.0 in | Wt 178.0 lb

## 2019-11-17 DIAGNOSIS — I6523 Occlusion and stenosis of bilateral carotid arteries: Secondary | ICD-10-CM | POA: Diagnosis not present

## 2019-11-17 NOTE — H&P (View-Only) (Signed)
[facility] [place]  Patient name: Victor Cox MRN: [id number] DOB: Aug 01, 1950 Sex: male  REASON FOR CONSULT: Evaluation critical right carotid stenosis, asymptomatic  HPI: Victor Cox is a 69 y.o. male, who is here today with his brother for discussion of carotid disease.  He was found to have a carotid bruit and underwent further evaluation.  He has ultrasound suggesting high-grade right carotid stenosis.  He is right-handed.  He has had no prior difficulty with TIA, amaurosis fugax, aphasia or stroke.  He has COPD requiring home oxygen.  No history of cardiac disease.  Past Medical History:  Diagnosis Date  . COPD (chronic obstructive pulmonary disease) (HCC)   . History of kidney stones   . Pneumothorax, right 08/13/2010   with complete collapse of the right lung    History reviewed. No pertinent family history.  SOCIAL HISTORY: Social History   Socioeconomic History  . Marital status: Married    Spouse name: Not on file  . Number of children: Not on file  . Years of education: Not on file  . Highest education level: Not on file  Occupational History  . Occupation: unemployed since 2007  Tobacco Use  . Smoking status: Current Every Day Smoker    Packs/day: 1.00    Years: 40.00    Pack years: 40.00    Types: Cigarettes  . Smokeless tobacco: Never Used  Substance and Sexual Activity  . Alcohol use: No  . Drug use: No  . Sexual activity: Not on file  Other Topics Concern  . Not on file  Social History Narrative  . Not on file   Social Determinants of Health   Financial Resource Strain:   . Difficulty of Paying Living Expenses: Not on file  Food Insecurity:   . Worried About Programme researcher, broadcasting/film/video in the Last Year: Not on file  . Ran Out of Food in the Last Year: Not on file  Transportation Needs:   . Lack of Transportation (Medical): Not on file  . Lack of Transportation (Non-Medical): Not on file   Physical Activity:   . Days of Exercise per Week: Not on file  . Minutes of Exercise per Session: Not on file  Stress:   . Feeling of Stress : Not on file  Social Connections:   . Frequency of Communication with Friends and Family: Not on file  . Frequency of Social Gatherings with Friends and Family: Not on file  . Attends Religious Services: Not on file  . Active Member of Clubs or Organizations: Not on file  . Attends Banker Meetings: Not on file  . Marital Status: Not on file  Intimate Partner Violence:   . Fear of Current or Ex-Partner: Not on file  . Emotionally Abused: Not on file  . Physically Abused: Not on file  . Sexually Abused: Not on file    No Known Allergies  Current Outpatient Medications  Medication Sig Dispense Refill  . albuterol (PROVENTIL) 2 MG tablet Take 2 mg by mouth 3 (three) times daily.      Marland Kitchen aspirin EC 81 MG tablet Take 81 mg by mouth daily. Swallow whole.    . losartan (COZAAR) 50 MG tablet Take 50 mg by mouth daily.    . metoprolol succinate (TOPROL-XL) 25 MG 24 hr tablet Take 25 mg by mouth daily.    Marland Kitchen spironolactone (ALDACTONE) 25 MG tablet Take by mouth.     No current facility-administered medications for  this visit.    REVIEW OF SYSTEMS:  [X]  denotes positive finding, [ ]  denotes negative finding Cardiac  Comments:  Chest pain or chest pressure:    Shortness of breath upon exertion: x   Short of breath when lying flat: x   Irregular heart rhythm:        Vascular    Pain in calf, thigh, or hip brought on by ambulation:    Pain in feet at night that wakes you up from your sleep:     Blood clot in your veins:    Leg swelling:         Pulmonary    Oxygen at home: x   Productive cough:  x   Wheezing:         Neurologic    Sudden weakness in arms or legs:     Sudden numbness in arms or legs:     Sudden onset of difficulty speaking or slurred speech:    Temporary loss of vision in one eye:     Problems with  dizziness:         Gastrointestinal    Blood in stool:     Vomited blood:         Genitourinary    Burning when urinating:     Blood in urine:        Psychiatric    Major depression:         Hematologic    Bleeding problems:    Problems with blood clotting too easily:        Skin    Rashes or ulcers:        Constitutional    Fever or chills:      PHYSICAL EXAM: Vitals:   11/17/19 1100  BP: 121/71  Pulse: 83  Resp: 18  Temp: (!) 97.5 F (36.4 C)  TempSrc: Other (Comment)  SpO2: 95%  Weight: 178 lb (80.7 kg)  Height: 5\' 10"  (1.778 m)    GENERAL: The patient is a well-nourished male, in no acute distress. The vital signs are documented above. CARDIOVASCULAR: Soft right carotid bruit and no bruit on the left.  2+ radial pulses bilaterally PULMONARY: There is good air exchange  ABDOMEN: Soft and non-tender  MUSCULOSKELETAL: There are no major deformities or cyanosis. NEUROLOGIC: No focal weakness or paresthesias are detected. SKIN: There are no ulcers or rashes noted. PSYCHIATRIC: The patient has a normal affect.  DATA:  Carotid duplex from [facility] on 11/12/2019 was reviewed with the patient.  This reveals critical right carotid stenosis and moderate left carotid stenosis  MEDICAL ISSUES: I discussed the significance of this with the patient and his brother.  Explained that this potentially puts him at increased risk for stroke related to his high-grade carotid stenosis.  I have recommended CT angiogram for further evaluation.  I explained that that this typically does confirm high-grade stenosis and allows to determine the extent of his carotid disease.  I explained that the this was indeed confirmed I would recommend right carotid endarterectomy for reduction of stroke risk.  I explained the procedure in detail with the patient including potential risk for stroke with surgery at 1 to 1-1/2%.  Also explained the slight risk of cranial nerve injury and  bleeding and infection.  He will undergo outpatient CT at [facility] and I will communicate with him following the CT.  If this confirms we will schedule surgery   11/14/2019, MD FACS [facility] Tel [phone] Pager [phone]

## 2019-11-17 NOTE — Progress Notes (Signed)
  [facility]  Patient name: Victor Cox MRN: [id number] DOB: 04/10/1950 Sex: male  REASON FOR CONSULT: Evaluation critical right carotid stenosis, asymptomatic  HPI: Victor Cox is a 69 y.o. male, who is here today with his brother for discussion of carotid disease.  He was found to have a carotid bruit and underwent further evaluation.  He has ultrasound suggesting high-grade right carotid stenosis.  He is right-handed.  He has had no prior difficulty with TIA, amaurosis fugax, aphasia or stroke.  He has COPD requiring home oxygen.  No history of cardiac disease.  Past Medical History:  Diagnosis Date  . COPD (chronic obstructive pulmonary disease) (HCC)   . History of kidney stones   . Pneumothorax, right 08/13/2010   with complete collapse of the right lung    History reviewed. No pertinent family history.  SOCIAL HISTORY: Social History   Socioeconomic History  . Marital status: Married    Spouse name: Not on file  . Number of children: Not on file  . Years of education: Not on file  . Highest education level: Not on file  Occupational History  . Occupation: unemployed since 2007  Tobacco Use  . Smoking status: Current Every Day Smoker    Packs/day: 1.00    Years: 40.00    Pack years: 40.00    Types: Cigarettes  . Smokeless tobacco: Never Used  Substance and Sexual Activity  . Alcohol use: No  . Drug use: No  . Sexual activity: Not on file  Other Topics Concern  . Not on file  Social History Narrative  . Not on file   Social Determinants of Health   Financial Resource Strain:   . Difficulty of Paying Living Expenses: Not on file  Food Insecurity:   . Worried About Running Out of Food in the Last Year: Not on file  . Ran Out of Food in the Last Year: Not on file  Transportation Needs:   . Lack of Transportation (Medical): Not on file  . Lack of Transportation (Non-Medical): Not on file   Physical Activity:   . Days of Exercise per Week: Not on file  . Minutes of Exercise per Session: Not on file  Stress:   . Feeling of Stress : Not on file  Social Connections:   . Frequency of Communication with Friends and Family: Not on file  . Frequency of Social Gatherings with Friends and Family: Not on file  . Attends Religious Services: Not on file  . Active Member of Clubs or Organizations: Not on file  . Attends Club or Organization Meetings: Not on file  . Marital Status: Not on file  Intimate Partner Violence:   . Fear of Current or Ex-Partner: Not on file  . Emotionally Abused: Not on file  . Physically Abused: Not on file  . Sexually Abused: Not on file    No Known Allergies  Current Outpatient Medications  Medication Sig Dispense Refill  . albuterol (PROVENTIL) 2 MG tablet Take 2 mg by mouth 3 (three) times daily.      . aspirin EC 81 MG tablet Take 81 mg by mouth daily. Swallow whole.    . losartan (COZAAR) 50 MG tablet Take 50 mg by mouth daily.    . metoprolol succinate (TOPROL-XL) 25 MG 24 hr tablet Take 25 mg by mouth daily.    . spironolactone (ALDACTONE) 25 MG tablet Take by mouth.     No current facility-administered medications for   this visit.    REVIEW OF SYSTEMS:  [X] denotes positive finding, [ ] denotes negative finding Cardiac  Comments:  Chest pain or chest pressure:    Shortness of breath upon exertion: x   Short of breath when lying flat: x   Irregular heart rhythm:        Vascular    Pain in calf, thigh, or hip brought on by ambulation:    Pain in feet at night that wakes you up from your sleep:     Blood clot in your veins:    Leg swelling:         Pulmonary    Oxygen at home: x   Productive cough:  x   Wheezing:         Neurologic    Sudden weakness in arms or legs:     Sudden numbness in arms or legs:     Sudden onset of difficulty speaking or slurred speech:    Temporary loss of vision in one eye:     Problems with  dizziness:         Gastrointestinal    Blood in stool:     Vomited blood:         Genitourinary    Burning when urinating:     Blood in urine:        Psychiatric    Major depression:         Hematologic    Bleeding problems:    Problems with blood clotting too easily:        Skin    Rashes or ulcers:        Constitutional    Fever or chills:      PHYSICAL EXAM: Vitals:   11/17/19 1100  BP: 121/71  Pulse: 83  Resp: 18  Temp: (!) 97.5 F (36.4 C)  TempSrc: Other (Comment)  SpO2: 95%  Weight: 178 lb (80.7 kg)  Height: 5' 10" (1.778 m)    GENERAL: The patient is a well-nourished male, in no acute distress. The vital signs are documented above. CARDIOVASCULAR: Soft right carotid bruit and no bruit on the left.  2+ radial pulses bilaterally PULMONARY: There is good air exchange  ABDOMEN: Soft and non-tender  MUSCULOSKELETAL: There are no major deformities or cyanosis. NEUROLOGIC: No focal weakness or paresthesias are detected. SKIN: There are no ulcers or rashes noted. PSYCHIATRIC: The patient has a normal affect.  DATA:  Carotid duplex from [facility] on 11/12/2019 was reviewed with the patient.  This reveals critical right carotid stenosis and moderate left carotid stenosis  MEDICAL ISSUES: I discussed the significance of this with the patient and his brother.  Explained that this potentially puts him at increased risk for stroke related to his high-grade carotid stenosis.  I have recommended CT angiogram for further evaluation.  I explained that that this typically does confirm high-grade stenosis and allows us to determine the extent of his carotid disease.  I explained that the this was indeed confirmed I would recommend right carotid endarterectomy for reduction of stroke risk.  I explained the procedure in detail with the patient including potential risk for stroke with surgery at 1 to 1-1/2%.  Also explained the slight risk of cranial nerve injury and  bleeding and infection.  He will undergo outpatient CT at [facility] and I will communicate with him following the CT.  If this confirms we will schedule surgery   Roshunda Keir F. Ameka Krigbaum, MD FACS [facility] Tel [phone] Pager [phone]

## 2019-11-21 ENCOUNTER — Other Ambulatory Visit: Payer: Self-pay | Admitting: *Deleted

## 2019-11-21 DIAGNOSIS — I6523 Occlusion and stenosis of bilateral carotid arteries: Secondary | ICD-10-CM

## 2019-11-26 ENCOUNTER — Other Ambulatory Visit: Payer: Self-pay

## 2019-11-26 DIAGNOSIS — I6523 Occlusion and stenosis of bilateral carotid arteries: Secondary | ICD-10-CM

## 2019-11-27 ENCOUNTER — Other Ambulatory Visit: Payer: Self-pay

## 2019-11-27 ENCOUNTER — Ambulatory Visit (HOSPITAL_COMMUNITY)
Admission: RE | Admit: 2019-11-27 | Discharge: 2019-11-27 | Disposition: A | Discharge status: Home / Self Care | Payer: Medicare Other | Source: Ambulatory Visit | Attending: Vascular Surgery | Admitting: Vascular Surgery

## 2019-11-27 DIAGNOSIS — I771 Stricture of artery: Secondary | ICD-10-CM | POA: Diagnosis not present

## 2019-11-27 DIAGNOSIS — I708 Atherosclerosis of other arteries: Secondary | ICD-10-CM | POA: Diagnosis not present

## 2019-11-27 DIAGNOSIS — I6523 Occlusion and stenosis of bilateral carotid arteries: Secondary | ICD-10-CM

## 2019-11-27 DIAGNOSIS — I672 Cerebral atherosclerosis: Secondary | ICD-10-CM | POA: Diagnosis not present

## 2019-11-27 LAB — POCT I-STAT CREATININE: Creatinine, Ser: 1.4 mg/dL — ABNORMAL HIGH (ref 0.61–1.24)

## 2019-11-27 MED ORDER — IOHEXOL 350 MG/ML SOLN
75.0000 mL | Freq: Once | INTRAVENOUS | Status: AC | PRN
  Administered 2019-11-27: 75 mL via INTRAVENOUS

## 2019-12-01 ENCOUNTER — Telehealth: Payer: Self-pay | Admitting: Vascular Surgery

## 2019-12-01 NOTE — Telephone Encounter (Signed)
I called the patient to discuss his recent CT angiogram of his neck.  I explained that this did confirm the duplex finding of critical carotid stenosis on the right with estimated 95% stenosis.  He has approximately 65% stenosis on the left carotid.  I have recommended right carotid endarterectomy for reduction of stroke risk.  I again explained the procedure including the 1 to 1/2% risk of stroke with surgery.  He wishes to proceed

## 2019-12-03 ENCOUNTER — Other Ambulatory Visit: Payer: Self-pay

## 2019-12-10 NOTE — Progress Notes (Signed)
[facility] [id number] - [place], [place] - [address] [place] [address] Phone: [phone] Fax: [phone]      Your procedure is scheduled on Tuesday 12/16/2019.  Report to [facility] Main Entrance "A" at 07:30 A.M., and check in at the Admitting office.  Call this number if you have problems the morning of surgery:  [phone]  Call [phone] if you have any questions prior to your surgery date Monday-Friday 8am-4pm    Remember:  Do not eat or drink after midnight the night before your surgery     Take these medicines the morning of surgery with A SIP OF WATER: Aspirin  Acetaminophen (Tylenol) - if needed Albuterol (Proventil) nebulizer - if needed  As of today, STOP taking any Aleve, Naproxen, Ibuprofen, Motrin, Advil, Goody's, BC's, all herbal medications, fish oil, and all vitamins.                       Do not wear jewelry            Do not wear lotions, powders, colognes, or deodorant.            Do not shave 48 hours prior to surgery.  Men may shave face and neck.            Do not bring valuables to the hospital.            [facility] is not responsible for any belongings or valuables.  Do NOT Smoke (Tobacco/Vaping) or drink Alcohol 24 hours prior to your procedure  If you use a CPAP at night, you may bring all equipment for your overnight stay.   Contacts, glasses, dentures or bridgework may not be worn into surgery.      For patients admitted to the hospital, discharge time will be determined by your treatment team.   Patients discharged the day of surgery will not be allowed to drive home, and someone needs to stay with them for 24 hours.    Special instructions:   [facility]- Preparing For Surgery  Before surgery, you can play an important role. Because skin is not sterile, your skin needs to be as free of germs as possible. You can reduce the number of germs on your skin by washing with CHG (chlorahexidine  gluconate) Soap before surgery.  CHG is an antiseptic cleaner which kills germs and bonds with the skin to continue killing germs even after washing.    Oral Hygiene is also important to reduce your risk of infection.  Remember - BRUSH YOUR TEETH THE MORNING OF SURGERY WITH YOUR REGULAR TOOTHPASTE  Please do not use if you have an allergy to CHG or antibacterial soaps. If your skin becomes reddened/irritated stop using the CHG.  Do not shave (including legs and underarms) for at least 48 hours prior to first CHG shower. It is OK to shave your face.  Please follow these instructions carefully.   1. Shower the NIGHT BEFORE SURGERY and the MORNING OF SURGERY with CHG Soap.   2. If you chose to wash your hair, wash your hair first as usual with your normal shampoo.  3. After you shampoo, rinse your hair and body thoroughly to remove the shampoo.  4. Use CHG as you would any other liquid soap. You can apply CHG directly to the skin and wash gently with a scrungie or a clean washcloth.   5. Apply the CHG Soap to your body ONLY FROM THE NECK  DOWN.  Do not use on open wounds or open sores. Avoid contact with your eyes, ears, mouth and genitals (private parts). Wash Face and genitals (private parts)  with your normal soap.   6. Wash thoroughly, paying special attention to the area where your surgery will be performed.  7. Thoroughly rinse your body with warm water from the neck down.  8. DO NOT shower/wash with your normal soap after using and rinsing off the CHG Soap.  9. Pat yourself dry with a CLEAN TOWEL.  10. Wear CLEAN PAJAMAS to bed the night before surgery  11. Place CLEAN SHEETS on your bed the night of your first shower and DO NOT SLEEP WITH PETS.   Day of Surgery: Shower with CHG soap as directed Wear Clean/Comfortable clothing the morning of surgery Do not apply any deodorants/lotions.   Remember to brush your teeth WITH YOUR REGULAR TOOTHPASTE.   Please read over the  following fact sheets that you were given.

## 2019-12-12 ENCOUNTER — Other Ambulatory Visit: Payer: Self-pay

## 2019-12-12 ENCOUNTER — Other Ambulatory Visit (HOSPITAL_COMMUNITY)
Admission: RE | Admit: 2019-12-12 | Discharge: 2019-12-12 | Disposition: A | Discharge status: Home / Self Care | Payer: Medicare Other | Source: Ambulatory Visit | Attending: Vascular Surgery | Admitting: Vascular Surgery

## 2019-12-12 ENCOUNTER — Encounter (HOSPITAL_COMMUNITY): Payer: Self-pay

## 2019-12-12 ENCOUNTER — Encounter (HOSPITAL_COMMUNITY)
Admission: RE | Admit: 2019-12-12 | Discharge: 2019-12-12 | Disposition: A | Discharge status: Home / Self Care | Payer: Medicare Other | Source: Ambulatory Visit | Attending: Vascular Surgery | Admitting: Vascular Surgery

## 2019-12-12 DIAGNOSIS — Z01818 Encounter for other preprocedural examination: Secondary | ICD-10-CM | POA: Insufficient documentation

## 2019-12-12 DIAGNOSIS — Z20822 Contact with and (suspected) exposure to covid-19: Secondary | ICD-10-CM | POA: Insufficient documentation

## 2019-12-12 DIAGNOSIS — Z01812 Encounter for preprocedural laboratory examination: Secondary | ICD-10-CM | POA: Diagnosis not present

## 2019-12-12 HISTORY — DX: Pneumonia, unspecified organism: J18.9

## 2019-12-12 HISTORY — DX: Dyspnea, unspecified: R06.00

## 2019-12-12 HISTORY — DX: Heart failure, unspecified: I50.9

## 2019-12-12 LAB — CBC
HCT: 45.6 % (ref 39.0–52.0)
Hemoglobin: 13.8 g/dL (ref 13.0–17.0)
MCH: 29.7 pg (ref 26.0–34.0)
MCHC: 30.3 g/dL (ref 30.0–36.0)
MCV: 98.1 fL (ref 80.0–100.0)
Platelets: 207 10*3/uL (ref 150–400)
RBC: 4.65 MIL/uL (ref 4.22–5.81)
RDW: 13.8 % (ref 11.5–15.5)
WBC: 10.7 10*3/uL — ABNORMAL HIGH (ref 4.0–10.5)
nRBC: 0 % (ref 0.0–0.2)

## 2019-12-12 LAB — COMPREHENSIVE METABOLIC PANEL
ALT: 10 U/L (ref 0–44)
AST: 17 U/L (ref 15–41)
Albumin: 3.5 g/dL (ref 3.5–5.0)
Alkaline Phosphatase: 53 U/L (ref 38–126)
Anion gap: 12 (ref 5–15)
BUN: 16 mg/dL (ref 8–23)
CO2: 22 mmol/L (ref 22–32)
Calcium: 8.8 mg/dL — ABNORMAL LOW (ref 8.9–10.3)
Chloride: 106 mmol/L (ref 98–111)
Creatinine, Ser: 1.58 mg/dL — ABNORMAL HIGH (ref 0.61–1.24)
GFR, Estimated: 47 mL/min — ABNORMAL LOW (ref >60)
Glucose, Bld: 99 mg/dL (ref 70–99)
Potassium: 4.4 mmol/L (ref 3.5–5.1)
Sodium: 140 mmol/L (ref 135–145)
Total Bilirubin: 0.9 mg/dL (ref 0.3–1.2)
Total Protein: 6.6 g/dL (ref 6.5–8.1)

## 2019-12-12 LAB — URINALYSIS, ROUTINE W REFLEX MICROSCOPIC
Bacteria, UA: NONE SEEN
Bilirubin Urine: NEGATIVE
Glucose, UA: NEGATIVE mg/dL
Hgb urine dipstick: NEGATIVE
Ketones, ur: 5 mg/dL — AB
Nitrite: NEGATIVE
Protein, ur: NEGATIVE mg/dL
Specific Gravity, Urine: 1.024 (ref 1.005–1.030)
pH: 5 (ref 5.0–8.0)

## 2019-12-12 LAB — TYPE AND SCREEN
ABO/RH(D): O NEG
Antibody Screen: NEGATIVE

## 2019-12-12 LAB — PROTIME-INR
INR: 1 (ref 0.8–1.2)
Prothrombin Time: 12.7 seconds (ref 11.4–15.2)

## 2019-12-12 LAB — SURGICAL PCR SCREEN
MRSA, PCR: NEGATIVE
Staphylococcus aureus: NEGATIVE

## 2019-12-12 LAB — APTT: aPTT: 31 seconds (ref 24–36)

## 2019-12-12 LAB — SARS CORONAVIRUS 2 (TAT 6-24 HRS): SARS Coronavirus 2: NEGATIVE

## 2019-12-12 NOTE — Progress Notes (Addendum)
PCP - Dr. Doreen Beam Cardiologist - Denies Had a cardiologist in the 80's per patient had heart failure. Saw note in Care everywhere that he had been seeing Dr. Molly Maduro cardiologist as recent as 2018 for CHF.   Chest x-ray - Not indicated EKG - Not indicated Stress Test - July 2016  ECHO - June 2018 Cardiac Cath - Denies  Sleep Study - Denies OSa  DM - Denies  Aspirin Instructions:Low dose aspirin  COVID TEST-  12/12/19  Anesthesia review:Yes h/o heart failure   Patient has shortness of breath at baseline d/t COPD, fever, cough and chest pain at PAT appointment   All instructions explained to the patient, with a verbal understanding of the material. Patient agrees to go over the instructions while at home for a better understanding. Patient also instructed to self quarantine after being tested for COVID-19. The opportunity to ask questions was provided.

## 2019-12-15 NOTE — Anesthesia Preprocedure Evaluation (Addendum)
Anesthesia Evaluation  Patient identified by MRN, date of birth, ID band Patient awake    Reviewed: Allergy & Precautions, NPO status , Patient's Chart, lab work & pertinent test results  Airway Mallampati: II  TM Distance: >3 FB     Dental   Pulmonary shortness of breath, pneumonia, COPD, Current Smoker and Patient abstained from smoking.,    breath sounds clear to auscultation       Cardiovascular +CHF   Rhythm:Regular Rate:Normal     Neuro/Psych negative neurological ROS  negative psych ROS   GI/Hepatic negative GI ROS, Neg liver ROS,   Endo/Other    Renal/GU negative Renal ROS     Musculoskeletal   Abdominal   Peds  Hematology   Anesthesia Other Findings   Reproductive/Obstetrics                           Anesthesia Physical Anesthesia Plan  ASA: III  Anesthesia Plan: General   Post-op Pain Management:    Induction: Intravenous  PONV Risk Score and Plan: Ondansetron and Dexamethasone  Airway Management Planned: Oral ETT  Additional Equipment: Arterial line  Intra-op Plan:   Post-operative Plan: Possible Post-op intubation/ventilation  Informed Consent: I have reviewed the patients History and Physical, chart, labs and discussed the procedure including the risks, benefits and alternatives for the proposed anesthesia with the patient or authorized representative who has indicated his/her understanding and acceptance.     Dental advisory given  Plan Discussed with: Anesthesiologist and CRNA  Anesthesia Plan Comments: (PAT note by Antionette Poles, PA-C: History of systolic heart failure.  He also has chronic DOE, however this was felt more related to his lung disease and less likely to be cardiac in origin.  Last seen by cardiologist Dr. Molly Maduro 06/28/2016 and per note he systolic function recovered.  Note states, "Echo reviewed by me today shows complete recovery of systolic  function and no valvular abnormalities. Again counseled about tobacco cessation and continue current medications he is not interested in coronary angiography."  I do not see any recommendation for follow-up.  Chronic medical conditions followed by PCP Dr. Doreen Beam.  Last seen 10/23/2019.  Per note patient has COPD on home oxygen 4 L at night.  PFT 11/2017 showed FEV1 43%, FVC 89%; 14% FEV1 improvement after neb.  Note discussed recent finding of high-grade right side ICA stenosis.  He was referred to vascular surgery for consideration of intervention.  Preop labs reviewed, creatinine mildly elevated at 1.58 which is similar to last labs in epic on 11/27/2019 which showed creatinine 1.40. PCP notes state hx of CKD III. Labs otherwise unremarkable.  EKG 12/12/2019: NSR.  Rate 70.  CTA neck 11/27/2019: CTA neck:  Bilateral carotid bifurcation atheromatous disease with greater than 95% right and approximately 65% left proximal ICA narrowing.  Mild narrowing of the great vessel origins and mid to distal bilateral subclavian arteries secondary to atheromatous disease.  Proximal left subclavian artery web.  TTE 06/28/2016 (copy on chart): Interpretation summary: A complete two-dimensional transthoracic echocardiogram with color flow Doppler and spectral Doppler was performed.  The study was technically difficult.   The left ventricle is normal in size.   There is mild concentric left ventricular pressure.   The left ventricular wall motion is normal.   The left ventricular ejection fraction is normal (60-65%).   Left ventricular diastolic function is normal.   Mild aortic sclerosis is present with good valvular opening.  Nuclear stress  07/21/2014 (copy on chart): Conclusion: Negative for ischemia, severe LV dysfunction.  Global left ventricular systolic function was, with an EF of 22%.  )      Anesthesia Quick Evaluation

## 2019-12-15 NOTE — Progress Notes (Signed)
Anesthesia Chart Review:   History of systolic heart failure.  He also has chronic DOE, however this was felt more related to his lung disease and less likely to be cardiac in origin.  Last seen by cardiologist Dr. Molly Maduro 06/28/2016 and per note he systolic function recovered.  Note states, "Echo reviewed by me today shows complete recovery of systolic function and no valvular abnormalities. Again counseled about tobacco cessation and continue current medications he is not interested in coronary angiography."  I do not see any recommendation for follow-up.  Chronic medical conditions followed by PCP Dr. Doreen Beam.  Last seen 10/23/2019.  Per note patient has COPD on home oxygen 4 L at night.  PFT 11/2017 showed FEV1 43%, FVC 89%; 14% FEV1 improvement after neb.  Note discussed recent finding of high-grade right side ICA stenosis.  He was referred to vascular surgery for consideration of intervention.  Preop labs reviewed, creatinine mildly elevated at 1.58 which is similar to last labs in epic on 11/27/2019 which showed creatinine 1.40. PCP notes state hx of CKD III. Labs otherwise unremarkable.  EKG 12/12/2019: NSR.  Rate 70.  CTA neck 11/27/2019: CTA neck:  Bilateral carotid bifurcation atheromatous disease with greater than 95% right and approximately 65% left proximal ICA narrowing.  Mild narrowing of the great vessel origins and mid to distal bilateral subclavian arteries secondary to atheromatous disease.  Proximal left subclavian artery web.  TTE 06/28/2016 (copy on chart): Interpretation summary: A complete two-dimensional transthoracic echocardiogram with color flow Doppler and spectral Doppler was performed.  The study was technically difficult.   The left ventricle is normal in size.   There is mild concentric left ventricular pressure.   The left ventricular wall motion is normal.   The left ventricular ejection fraction is normal (60-65%).   Left ventricular diastolic  function is normal.   Mild aortic sclerosis is present with good valvular opening.  Nuclear stress 07/21/2014 (copy on chart): Conclusion: Negative for ischemia, severe LV dysfunction.  Global left ventricular systolic function was, with an EF of 22%.   Zannie Cove [facility]/Anesthesiology Phone [phone] 12/15/2019 1:54 PM

## 2019-12-16 ENCOUNTER — Other Ambulatory Visit: Payer: Self-pay

## 2019-12-16 ENCOUNTER — Inpatient Hospital Stay (HOSPITAL_COMMUNITY): Payer: Medicare Other | Admitting: Anesthesiology

## 2019-12-16 ENCOUNTER — Inpatient Hospital Stay (HOSPITAL_COMMUNITY)
Admission: RE | Admit: 2019-12-16 | Discharge: 2019-12-17 | DRG: 039 | Disposition: A | Discharge status: Home / Self Care | Payer: Medicare Other | Attending: Vascular Surgery | Admitting: Vascular Surgery

## 2019-12-16 ENCOUNTER — Inpatient Hospital Stay (HOSPITAL_COMMUNITY): Payer: Medicare Other | Admitting: Emergency Medicine

## 2019-12-16 ENCOUNTER — Encounter (HOSPITAL_COMMUNITY)
Admission: RE | Disposition: A | Discharge status: Home / Self Care | Payer: Self-pay | Source: Home / Self Care | Attending: Vascular Surgery

## 2019-12-16 ENCOUNTER — Encounter (HOSPITAL_COMMUNITY): Payer: Self-pay | Admitting: Vascular Surgery

## 2019-12-16 DIAGNOSIS — I509 Heart failure, unspecified: Secondary | ICD-10-CM | POA: Diagnosis present

## 2019-12-16 DIAGNOSIS — I6521 Occlusion and stenosis of right carotid artery: Secondary | ICD-10-CM | POA: Diagnosis not present

## 2019-12-16 DIAGNOSIS — Z87442 Personal history of urinary calculi: Secondary | ICD-10-CM

## 2019-12-16 DIAGNOSIS — F17210 Nicotine dependence, cigarettes, uncomplicated: Secondary | ICD-10-CM | POA: Diagnosis present

## 2019-12-16 DIAGNOSIS — Z79899 Other long term (current) drug therapy: Secondary | ICD-10-CM

## 2019-12-16 DIAGNOSIS — J449 Chronic obstructive pulmonary disease, unspecified: Secondary | ICD-10-CM | POA: Diagnosis not present

## 2019-12-16 DIAGNOSIS — Z7982 Long term (current) use of aspirin: Secondary | ICD-10-CM | POA: Diagnosis not present

## 2019-12-16 DIAGNOSIS — R0989 Other specified symptoms and signs involving the circulatory and respiratory systems: Secondary | ICD-10-CM | POA: Diagnosis present

## 2019-12-16 DIAGNOSIS — J9383 Other pneumothorax: Secondary | ICD-10-CM | POA: Diagnosis not present

## 2019-12-16 DIAGNOSIS — J189 Pneumonia, unspecified organism: Secondary | ICD-10-CM | POA: Diagnosis not present

## 2019-12-16 HISTORY — PX: ENDARTERECTOMY: SHX5162

## 2019-12-16 LAB — CBC
HCT: 40.8 % (ref 39.0–52.0)
Hemoglobin: 12 g/dL — ABNORMAL LOW (ref 13.0–17.0)
MCH: 29.3 pg (ref 26.0–34.0)
MCHC: 29.4 g/dL — ABNORMAL LOW (ref 30.0–36.0)
MCV: 99.8 fL (ref 80.0–100.0)
Platelets: 166 10*3/uL (ref 150–400)
RBC: 4.09 MIL/uL — ABNORMAL LOW (ref 4.22–5.81)
RDW: 13.7 % (ref 11.5–15.5)
WBC: 12.4 10*3/uL — ABNORMAL HIGH (ref 4.0–10.5)
nRBC: 0 % (ref 0.0–0.2)

## 2019-12-16 LAB — CREATININE, SERUM
Creatinine, Ser: 1.46 mg/dL — ABNORMAL HIGH (ref 0.61–1.24)
GFR, Estimated: 51 mL/min — ABNORMAL LOW (ref >60)

## 2019-12-16 LAB — ABO/RH: ABO/RH(D): O NEG

## 2019-12-16 SURGERY — ENDARTERECTOMY, CAROTID
Anesthesia: General | Site: Neck | Laterality: Right

## 2019-12-16 MED ORDER — SODIUM CHLORIDE 0.9 % IV SOLN: 500.0000 mL | Freq: Once | INTRAVENOUS | Status: DC | PRN

## 2019-12-16 MED ORDER — SODIUM CHLORIDE 0.9 % IV SOLN
INTRAVENOUS | Status: DC | PRN
  Administered 2019-12-16: 09:00:00 500 mL

## 2019-12-16 MED ORDER — PROTAMINE SULFATE 10 MG/ML IV SOLN
INTRAVENOUS | Status: AC
  Filled 2019-12-16: qty 5

## 2019-12-16 MED ORDER — SODIUM CHLORIDE 0.9 % IV SOLN: INTRAVENOUS | Status: DC

## 2019-12-16 MED ORDER — FENTANYL CITRATE (PF) 100 MCG/2ML IJ SOLN: 25.0000 ug | INTRAMUSCULAR | Status: DC | PRN

## 2019-12-16 MED ORDER — HEPARIN SODIUM (PORCINE) 1000 UNIT/ML IJ SOLN
INTRAMUSCULAR | Status: DC | PRN
  Administered 2019-12-16: 8000 [IU] via INTRAVENOUS

## 2019-12-16 MED ORDER — LIDOCAINE 2% (20 MG/ML) 5 ML SYRINGE
INTRAMUSCULAR | Status: DC | PRN
  Administered 2019-12-16: 40 mg via INTRAVENOUS
  Administered 2019-12-16: 60 mg via INTRAVENOUS

## 2019-12-16 MED ORDER — GUAIFENESIN-DM 100-10 MG/5ML PO SYRP: 15.0000 mL | ORAL_SOLUTION | ORAL | Status: DC | PRN

## 2019-12-16 MED ORDER — METOPROLOL TARTRATE 5 MG/5ML IV SOLN: 2.0000 mg | INTRAVENOUS | Status: DC | PRN

## 2019-12-16 MED ORDER — ROCURONIUM BROMIDE 10 MG/ML (PF) SYRINGE
PREFILLED_SYRINGE | INTRAVENOUS | Status: AC
  Filled 2019-12-16: qty 10

## 2019-12-16 MED ORDER — METOPROLOL SUCCINATE ER 25 MG PO TB24
25.0000 mg | ORAL_TABLET | Freq: Every day | ORAL | Status: DC
  Administered 2019-12-17: 25 mg via ORAL
  Filled 2019-12-16: qty 1

## 2019-12-16 MED ORDER — FENTANYL CITRATE (PF) 250 MCG/5ML IJ SOLN
INTRAMUSCULAR | Status: DC | PRN
  Administered 2019-12-16 (×5): 50 ug via INTRAVENOUS

## 2019-12-16 MED ORDER — GLYCOPYRROLATE 0.2 MG/ML IJ SOLN
INTRAMUSCULAR | Status: DC | PRN
  Administered 2019-12-16: .2 mg via INTRAVENOUS

## 2019-12-16 MED ORDER — LACTATED RINGERS IV SOLN: INTRAVENOUS | Status: DC

## 2019-12-16 MED ORDER — CEFAZOLIN SODIUM-DEXTROSE 2-4 GM/100ML-% IV SOLN
2.0000 g | Freq: Three times a day (TID) | INTRAVENOUS | Status: AC
  Administered 2019-12-16 – 2019-12-17 (×2): 2 g via INTRAVENOUS
  Filled 2019-12-16 (×2): qty 100

## 2019-12-16 MED ORDER — PANTOPRAZOLE SODIUM 40 MG PO TBEC
40.0000 mg | DELAYED_RELEASE_TABLET | Freq: Every day | ORAL | Status: DC
  Administered 2019-12-16 – 2019-12-17 (×2): 40 mg via ORAL
  Filled 2019-12-16 (×2): qty 1

## 2019-12-16 MED ORDER — POTASSIUM CHLORIDE CRYS ER 20 MEQ PO TBCR: 20.0000 meq | EXTENDED_RELEASE_TABLET | Freq: Every day | ORAL | Status: DC | PRN

## 2019-12-16 MED ORDER — CHLORHEXIDINE GLUCONATE 0.12 % MT SOLN
15.0000 mL | Freq: Once | OROMUCOSAL | Status: DC
  Filled 2019-12-16: qty 15

## 2019-12-16 MED ORDER — HYDROMORPHONE HCL 1 MG/ML IJ SOLN: 0.5000 mg | INTRAMUSCULAR | Status: DC | PRN

## 2019-12-16 MED ORDER — ONDANSETRON HCL 4 MG/2ML IJ SOLN
INTRAMUSCULAR | Status: AC
  Filled 2019-12-16: qty 2

## 2019-12-16 MED ORDER — PROTAMINE SULFATE 10 MG/ML IV SOLN
INTRAVENOUS | Status: DC | PRN
  Administered 2019-12-16: 10 mg via INTRAVENOUS
  Administered 2019-12-16: 40 mg via INTRAVENOUS

## 2019-12-16 MED ORDER — LIDOCAINE HCL (PF) 1 % IJ SOLN
INTRAMUSCULAR | Status: AC
  Filled 2019-12-16: qty 5

## 2019-12-16 MED ORDER — LIDOCAINE HCL (PF) 2 % IJ SOLN
INTRAMUSCULAR | Status: AC
  Filled 2019-12-16: qty 10

## 2019-12-16 MED ORDER — ACETAMINOPHEN 325 MG PO TABS: 325.0000 mg | ORAL_TABLET | ORAL | Status: DC | PRN

## 2019-12-16 MED ORDER — ONDANSETRON HCL 4 MG/2ML IJ SOLN: 4.0000 mg | Freq: Four times a day (QID) | INTRAMUSCULAR | Status: DC | PRN

## 2019-12-16 MED ORDER — PHENOL 1.4 % MT LIQD: 1.0000 | OROMUCOSAL | Status: DC | PRN

## 2019-12-16 MED ORDER — SPIRONOLACTONE 12.5 MG HALF TABLET
12.5000 mg | ORAL_TABLET | Freq: Every day | ORAL | Status: DC
  Administered 2019-12-17: 12.5 mg via ORAL
  Filled 2019-12-16: qty 1

## 2019-12-16 MED ORDER — PHENYLEPHRINE HCL-NACL 10-0.9 MG/250ML-% IV SOLN
INTRAVENOUS | Status: DC | PRN
  Administered 2019-12-16: 25 ug/min via INTRAVENOUS

## 2019-12-16 MED ORDER — PROPOFOL 10 MG/ML IV BOLUS
INTRAVENOUS | Status: DC | PRN
  Administered 2019-12-16: 130 mg via INTRAVENOUS

## 2019-12-16 MED ORDER — SUGAMMADEX SODIUM 200 MG/2ML IV SOLN
INTRAVENOUS | Status: DC | PRN
  Administered 2019-12-16: 200 mg via INTRAVENOUS

## 2019-12-16 MED ORDER — ROCURONIUM BROMIDE 10 MG/ML (PF) SYRINGE
PREFILLED_SYRINGE | INTRAVENOUS | Status: DC | PRN
  Administered 2019-12-16: 60 mg via INTRAVENOUS
  Administered 2019-12-16: 40 mg via INTRAVENOUS

## 2019-12-16 MED ORDER — PHENYLEPHRINE 40 MCG/ML (10ML) SYRINGE FOR IV PUSH (FOR BLOOD PRESSURE SUPPORT)
PREFILLED_SYRINGE | INTRAVENOUS | Status: DC | PRN
  Administered 2019-12-16 (×2): 120 ug via INTRAVENOUS
  Administered 2019-12-16 (×2): 80 ug via INTRAVENOUS

## 2019-12-16 MED ORDER — EPHEDRINE 5 MG/ML INJ
INTRAVENOUS | Status: AC
  Filled 2019-12-16: qty 10

## 2019-12-16 MED ORDER — CHLORHEXIDINE GLUCONATE CLOTH 2 % EX PADS: 6.0000 | MEDICATED_PAD | Freq: Once | CUTANEOUS | Status: DC

## 2019-12-16 MED ORDER — HEPARIN SODIUM (PORCINE) 1000 UNIT/ML IJ SOLN
INTRAMUSCULAR | Status: AC
  Filled 2019-12-16: qty 1

## 2019-12-16 MED ORDER — SENNOSIDES-DOCUSATE SODIUM 8.6-50 MG PO TABS: 1.0000 | ORAL_TABLET | Freq: Every evening | ORAL | Status: DC | PRN

## 2019-12-16 MED ORDER — HYDRALAZINE HCL 20 MG/ML IJ SOLN: 5.0000 mg | INTRAMUSCULAR | Status: DC | PRN

## 2019-12-16 MED ORDER — FENTANYL CITRATE (PF) 250 MCG/5ML IJ SOLN
INTRAMUSCULAR | Status: AC
  Filled 2019-12-16: qty 5

## 2019-12-16 MED ORDER — PHENYLEPHRINE 40 MCG/ML (10ML) SYRINGE FOR IV PUSH (FOR BLOOD PRESSURE SUPPORT)
PREFILLED_SYRINGE | INTRAVENOUS | Status: AC
  Filled 2019-12-16: qty 10

## 2019-12-16 MED ORDER — CEFAZOLIN SODIUM-DEXTROSE 2-4 GM/100ML-% IV SOLN
2.0000 g | INTRAVENOUS | Status: AC
  Administered 2019-12-16: 2 g via INTRAVENOUS
  Filled 2019-12-16: qty 100

## 2019-12-16 MED ORDER — 0.9 % SODIUM CHLORIDE (POUR BTL) OPTIME
TOPICAL | Status: DC | PRN
  Administered 2019-12-16: 2000 mL

## 2019-12-16 MED ORDER — ALBUMIN HUMAN 5 % IV SOLN: INTRAVENOUS | Status: DC | PRN

## 2019-12-16 MED ORDER — DEXAMETHASONE SODIUM PHOSPHATE 10 MG/ML IJ SOLN
INTRAMUSCULAR | Status: AC
  Filled 2019-12-16: qty 1

## 2019-12-16 MED ORDER — SODIUM CHLORIDE 0.9 % IV SOLN
INTRAVENOUS | Status: AC
  Filled 2019-12-16: qty 1.2

## 2019-12-16 MED ORDER — LOSARTAN POTASSIUM 50 MG PO TABS
50.0000 mg | ORAL_TABLET | Freq: Every day | ORAL | Status: DC
  Administered 2019-12-17: 50 mg via ORAL
  Filled 2019-12-16: qty 1

## 2019-12-16 MED ORDER — MAGNESIUM SULFATE 2 GM/50ML IV SOLN: 2.0000 g | Freq: Every day | INTRAVENOUS | Status: DC | PRN

## 2019-12-16 MED ORDER — DOCUSATE SODIUM 100 MG PO CAPS
100.0000 mg | ORAL_CAPSULE | Freq: Every day | ORAL | Status: DC
  Administered 2019-12-17: 100 mg via ORAL
  Filled 2019-12-16: qty 1

## 2019-12-16 MED ORDER — DEXAMETHASONE SODIUM PHOSPHATE 10 MG/ML IJ SOLN
INTRAMUSCULAR | Status: DC | PRN
  Administered 2019-12-16: 10 mg via INTRAVENOUS

## 2019-12-16 MED ORDER — ONDANSETRON HCL 4 MG/2ML IJ SOLN
INTRAMUSCULAR | Status: DC | PRN
  Administered 2019-12-16: 4 mg via INTRAVENOUS

## 2019-12-16 MED ORDER — PROPOFOL 10 MG/ML IV BOLUS
INTRAVENOUS | Status: AC
  Filled 2019-12-16: qty 20

## 2019-12-16 MED ORDER — OXYCODONE-ACETAMINOPHEN 5-325 MG PO TABS: 1.0000 | ORAL_TABLET | ORAL | Status: DC | PRN

## 2019-12-16 MED ORDER — ACETAMINOPHEN 650 MG RE SUPP: 325.0000 mg | RECTAL | Status: DC | PRN

## 2019-12-16 MED ORDER — ALUM & MAG HYDROXIDE-SIMETH 200-200-20 MG/5ML PO SUSP: 15.0000 mL | ORAL | Status: DC | PRN

## 2019-12-16 MED ORDER — ALBUTEROL SULFATE (2.5 MG/3ML) 0.083% IN NEBU
2.5000 mg | INHALATION_SOLUTION | Freq: Two times a day (BID) | RESPIRATORY_TRACT | Status: DC
  Administered 2019-12-16 – 2019-12-17 (×2): 2.5 mg via RESPIRATORY_TRACT
  Filled 2019-12-16 (×2): qty 3

## 2019-12-16 MED ORDER — ALBUTEROL SULFATE HFA 108 (90 BASE) MCG/ACT IN AERS
INHALATION_SPRAY | RESPIRATORY_TRACT | Status: AC
  Filled 2019-12-16: qty 6.7

## 2019-12-16 MED ORDER — ASPIRIN EC 81 MG PO TBEC
81.0000 mg | DELAYED_RELEASE_TABLET | Freq: Every day | ORAL | Status: DC
  Administered 2019-12-17: 81 mg via ORAL
  Filled 2019-12-16: qty 1

## 2019-12-16 MED ORDER — ORAL CARE MOUTH RINSE: 15.0000 mL | Freq: Once | OROMUCOSAL | Status: DC

## 2019-12-16 MED ORDER — LABETALOL HCL 5 MG/ML IV SOLN: 10.0000 mg | INTRAVENOUS | Status: DC | PRN

## 2019-12-16 MED ORDER — HEPARIN SODIUM (PORCINE) 5000 UNIT/ML IJ SOLN
5000.0000 [IU] | Freq: Three times a day (TID) | INTRAMUSCULAR | Status: DC
  Administered 2019-12-17: 5000 [IU] via SUBCUTANEOUS
  Filled 2019-12-16: qty 1

## 2019-12-16 SURGICAL SUPPLY — 44 items
CANISTER SUCT 3000ML PPV (MISCELLANEOUS) ×3 IMPLANT
CANNULA VESSEL 3MM 2 BLNT TIP (CANNULA) ×6 IMPLANT
CATH ROBINSON RED A/P 18FR (CATHETERS) ×3 IMPLANT
CLIP LIGATING EXTRA MED SLVR (CLIP) ×3 IMPLANT
CLIP LIGATING EXTRA SM BLUE (MISCELLANEOUS) ×3 IMPLANT
COVER WAND RF STERILE (DRAPES) IMPLANT
DECANTER SPIKE VIAL GLASS SM (MISCELLANEOUS) IMPLANT
DERMABOND ADVANCED (GAUZE/BANDAGES/DRESSINGS) ×2
DERMABOND ADVANCED .7 DNX12 (GAUZE/BANDAGES/DRESSINGS) ×1 IMPLANT
DRAIN HEMOVAC 1/8 X 5 (WOUND CARE) IMPLANT
ELECT REM PT RETURN 9FT ADLT (ELECTROSURGICAL) ×3
ELECTRODE REM PT RTRN 9FT ADLT (ELECTROSURGICAL) ×1 IMPLANT
EVACUATOR SILICONE 100CC (DRAIN) IMPLANT
GLOVE BIO SURGEON STRL SZ7 (GLOVE) ×3 IMPLANT
GLOVE BIOGEL PI IND STRL 6.5 (GLOVE) ×3 IMPLANT
GLOVE BIOGEL PI IND STRL 7.0 (GLOVE) ×1 IMPLANT
GLOVE BIOGEL PI INDICATOR 6.5 (GLOVE) ×6
GLOVE BIOGEL PI INDICATOR 7.0 (GLOVE) ×2
GLOVE ECLIPSE 6.5 STRL STRAW (GLOVE) ×6 IMPLANT
GLOVE SS BIOGEL STRL SZ 7.5 (GLOVE) ×1 IMPLANT
GLOVE SUPERSENSE BIOGEL SZ 7.5 (GLOVE) ×2
GOWN STRL REUS W/ TWL LRG LVL3 (GOWN DISPOSABLE) ×4 IMPLANT
GOWN STRL REUS W/TWL LRG LVL3 (GOWN DISPOSABLE) ×8
KIT BASIN OR (CUSTOM PROCEDURE TRAY) ×3 IMPLANT
KIT SHUNT ARGYLE CAROTID ART 6 (VASCULAR PRODUCTS) IMPLANT
KIT TURNOVER KIT B (KITS) ×3 IMPLANT
NEEDLE 22X1 1/2 (OR ONLY) (NEEDLE) IMPLANT
NS IRRIG 1000ML POUR BTL (IV SOLUTION) ×6 IMPLANT
PACK CAROTID (CUSTOM PROCEDURE TRAY) ×3 IMPLANT
PAD ARMBOARD 7.5X6 YLW CONV (MISCELLANEOUS) ×6 IMPLANT
PATCH HEMASHIELD 8X75 (Vascular Products) ×3 IMPLANT
POSITIONER HEAD DONUT 9IN (MISCELLANEOUS) ×3 IMPLANT
SHUNT CAROTID BYPASS 10 (VASCULAR PRODUCTS) ×3 IMPLANT
SHUNT CAROTID BYPASS 12FRX15.5 (VASCULAR PRODUCTS) IMPLANT
SUT ETHILON 3 0 PS 1 (SUTURE) IMPLANT
SUT PROLENE 6 0 CC (SUTURE) ×6 IMPLANT
SUT SILK 3 0 (SUTURE)
SUT SILK 3-0 18XBRD TIE 12 (SUTURE) IMPLANT
SUT VIC AB 3-0 SH 27 (SUTURE) ×4
SUT VIC AB 3-0 SH 27X BRD (SUTURE) ×2 IMPLANT
SUT VICRYL 4-0 PS2 18IN ABS (SUTURE) ×3 IMPLANT
SYR CONTROL 10ML LL (SYRINGE) IMPLANT
TOWEL GREEN STERILE (TOWEL DISPOSABLE) ×3 IMPLANT
WATER STERILE IRR 1000ML POUR (IV SOLUTION) ×3 IMPLANT

## 2019-12-16 NOTE — Op Note (Signed)
   OPERATIVE REPORT  DATE OF SURGERY: 12/16/2019  PATIENT: Victor Cox, 69 y.o. male MRN: [id number]  DOB: [birth year]  PRE-OPERATIVE DIAGNOSIS: Right Carotid Stenosis, Asymptomatic  POST-OPERATIVE DIAGNOSIS:  Same  PROCEDURE:  Right Carotid Endarterectomy with Dacron Patch Angioplasty  SURGEON:  Gretta Began, M.D.  PHYSICIAN ASSISTANT: Edilia Bo MD  The assistant was needed for exposure and to expedite the case  ANESTHESIA:   general  EBL: Less than 200 ml  Total I/O In: 1750 [I.V.:1400; IV Piggyback:350] Out: 50 [Blood:50]  BLOOD ADMINISTERED: none  DRAINS: none   SPECIMEN: none  COUNTS CORRECT:  YES  PLAN OF CARE: Admit to inpatient   PATIENT DISPOSITION:  PACU - hemodynamically stable and neurologically intact.  PROCEDURE DETAILS: The patient was taken to the operating room placed in supine position.  General anesthesia was administered.  The neck was prepped and draped in the usual sterile fashion.  An incision was made anterior to the sternocleidomastoid and carried down through the platysma with electrocautery.  The sternocleidomastoid was reflected posteriorly and the carotid sheath was opened.  The facial vein was ligated with 2-0 silk ties and divided.  The common carotid artery was encircled with an umbilical tape and Rummel tourniquet.  The vagus nerve was identified and preserved.  Dissection was continued onto the carotid bifurcation.  The superior thyroid artery was encircled with a 2-0 silk Potts tie.  The external carotid was encircled with a blue vessel loop and the internal carotid was encircled with an umbilical tape and Rummel tourniquet.  The hypoglossal nerve was identified and preserved.  The patient was given systemic heparin and after adequate circulation time, the internal, external and common carotid arteries were occluded with vascular clamps.  The common carotid artery was opened with an 11 blade and extended  longitudinally with Potts scissors.  A 10  shunt was passed up the internal carotid and allowed to backbleed.  It was then passed down the common carotid where it was secured with Rummel tourniquet.  The endarterectomy was begun on the common carotid artery and the plaque was divided proximally with Potts scissors.  The endarterectomy was continued onto the bifurcation.  The external carotid was endarterectomized with an eversion technique and the internal carotid was endarterectomized in an open fashion.  Remaining atheromatous debris was removed from the endarterectomy plane.  A Finesse Hemashield Dacron patch was brought onto the field and was sewn as a patch angioplasty with a running 6-0 Prolene suture.  Prior to completion of the closure the shunt was removed and the usual flushing maneuvers were undertaken.  The anastomosis was completed and flow was restored first to the external and then the internal carotid artery.  Excellent flow characteristics were noted with hand-held Doppler in the internal and external carotid arteries.  The patient was given 50 mg of protamine to reverse the heparin.  The wounds were irrigated with saline.  Hemostasis was obtained with electrocautery.  The wounds were closed with 3-0 Vicryl to reapproximate the sternocleidomastoid over the carotid sheath.  The platysma was lysed with a running 3-0 Vicryl suture.  The skin was closed with a 4-0 subcuticular Vicryl stitch.  Dermabond was applied.  The patient was awakened neurologically intact in the operating room and transferred to the recovery room in stable condition   Gretta Began, M.D. 12/16/2019 11:38 AM

## 2019-12-16 NOTE — Progress Notes (Signed)
°  Day of Surgery Note    Subjective:  Incisional soreness. Thirsty. Denies CP.   Vitals:   12/16/19 1400 12/16/19 1500  BP: 139/68 133/68  Pulse: 60 (!) 58  Resp: 16 12  Temp:    SpO2: 96% 97%    Incisions:   Right neck soft without hematoma Extremities:  Moves all well Cardiac:  RRR Lungs:  Non-labored resp Neuro: Tongue midline. Face symmetric. A and O times 4   Assessment/Plan:  This is a 69 y.o. male who is s/p right CEA. Sys BP 130s. Neuro intact.   -Continue current treatment plan.   Wendi Maya, PA-C 12/16/2019 4:03 PM [phone]

## 2019-12-16 NOTE — Discharge Instructions (Signed)
   [facility]   Discharge Instructions   Carotid Surgery  Please refer to the following instructions for your post-procedure care. Your surgeon or physician assistant will discuss any changes with you.  Activity  You are encouraged to walk as much as you can. You can slowly return to normal activities but must avoid strenuous activity and heavy lifting until your doctor tell you it's okay. Avoid activities such as vacuuming or swinging a golf club. You can drive after one week if you are comfortable and you are no longer taking prescription pain medications. It is normal to feel tired for serval weeks after your surgery. It is also normal to have difficulty with sleep habits, eating, and bowel movements after surgery. These will go away with time.  Bathing/Showering  Shower daily after you go home. Do not soak in a bathtub, hot tub, or swim until the incision heals completely.  Incision Care  Shower every day. Clean your incision with mild soap and water. Pat the area dry with a clean towel. You do not need a bandage unless otherwise instructed. Do not apply any ointments or creams to your incision. You may have skin glue on your incision. Do not peel it off. It will come off on its own in about one week. Your incision may feel thickened and raised for several weeks after your surgery. This is normal and the skin will soften over time.   For Men Only: It's okay to shave around the incision but do not shave the incision itself for 2 weeks. It is common to have numbness under your chin that could last for several months.  Diet  Resume your normal diet. There are no special food restrictions following this procedure. A low fat/low cholesterol diet is recommended for all patients with vascular disease. In order to heal from your surgery, it is CRITICAL to get adequate nutrition. Your body requires vitamins, minerals, and protein. Vegetables are the best source of  vitamins and minerals. Vegetables also provide the perfect balance of protein. Processed food has little nutritional value, so try to avoid this.  Medications  Resume taking all of your medications unless your doctor or physician assistant tells you not to. If your incision is causing pain, you may take over-the- counter pain relievers such as acetaminophen (Tylenol). If you were prescribed a stronger pain medication, please be aware these medications can cause nausea and constipation. Prevent nausea by taking the medication with a snack or meal. Avoid constipation by drinking plenty of fluids and eating foods with a high amount of fiber, such as fruits, vegetables, and grains.   Do not take Tylenol if you are taking prescription pain medications.  Follow Up  Our office will schedule a follow up appointment 2-3 weeks following discharge.  Please call us immediately for any of the following conditions  . Increased pain, redness, drainage (pus) from your incision site. . Fever of 101 degrees or higher. . If you should develop stroke (slurred speech, difficulty swallowing, weakness on one side of your body, loss of vision) you should call 911 and go to the nearest emergency room. .  Reduce your risk of vascular disease:  . Stop smoking. If you would like help call [facility] at 1-800-QUIT-NOW (1-800-784-8669) or [place] at 336-586-4000. . Manage your cholesterol . Maintain a desired weight . Control your diabetes . Keep your blood pressure down .  If you have any questions, please call the office at [phone]. 

## 2019-12-16 NOTE — Progress Notes (Signed)
Patient arrived to 4E from PACU. Patient oriented to room and staff. VSS. Telemetry box applied, CCMD notified. CHG bath completed.   Kenard Gower, RN

## 2019-12-16 NOTE — Anesthesia Procedure Notes (Signed)
Procedure Name: Intubation Date/Time: 12/16/2019 9:17 AM Performed by: Marena Chancy, CRNA Pre-anesthesia Checklist: Patient identified, Emergency Drugs available, Suction available and Patient being monitored Patient Re-evaluated:Patient Re-evaluated prior to induction Oxygen Delivery Method: Circle System Utilized Preoxygenation: Pre-oxygenation with 100% oxygen Induction Type: IV induction Ventilation: Mask ventilation without difficulty and Oral airway inserted - appropriate to patient size Laryngoscope Size: Hyacinth Meeker and 2 Grade View: Grade I Tube type: Oral Tube size: 7.5 mm Number of attempts: 1 Airway Equipment and Method: Stylet and Oral airway Placement Confirmation: ETT inserted through vocal cords under direct vision,  positive ETCO2 and breath sounds checked- equal and bilateral Tube secured with: Tape Dental Injury: Teeth and Oropharynx as per pre-operative assessment

## 2019-12-16 NOTE — Transfer of Care (Signed)
Immediate Anesthesia Transfer of Care Note  Patient: Victor Cox  Procedure(s) Performed: RIGHT CAROTID ENDARTERECTOMY with PATCH ANGIOPLASTY (Right Neck)  Patient Location: PACU  Anesthesia Type:General  Level of Consciousness: awake, alert  and oriented  Airway & Oxygen Therapy: Patient Spontanous Breathing and Patient connected to face mask oxygen  Post-op Assessment: Report given to RN, Post -op Vital signs reviewed and stable, Patient moving all extremities, Patient moving all extremities X 4 and Patient able to stick tongue midline  Post vital signs: Reviewed and stable  Last Vitals:  Vitals Value Taken Time  BP 113/52 12/16/19 1131  Temp 36.4 C 12/16/19 1130  Pulse 79 12/16/19 1134  Resp 14 12/16/19 1134  SpO2 97 % 12/16/19 1134  Vitals shown include unvalidated device data.  Last Pain:  Vitals:   12/16/19 1130  PainSc: (P) 0-No pain         Complications: No complications documented.

## 2019-12-16 NOTE — Anesthesia Procedure Notes (Signed)
Arterial Line Insertion Start/End11/30/2021 8:35 AM, 12/16/2019 8:40 AM Performed by: CRNA  Patient location: Pre-op. Preanesthetic checklist: patient identified, IV checked, site marked, risks and benefits discussed, surgical consent, monitors and equipment checked, pre-op evaluation, timeout performed and anesthesia consent Lidocaine 1% used for infiltration Right, radial was placed Catheter size: 20 G Hand hygiene performed  and maximum sterile barriers used   Attempts: 1 Procedure performed without using ultrasound guided technique. Following insertion, dressing applied and Biopatch. Post procedure assessment: normal  Patient tolerated the procedure well with no immediate complications.

## 2019-12-16 NOTE — Interval H&P Note (Signed)
History and Physical Interval Note:  12/16/2019 8:46 AM  Victor Cox  has presented today for surgery, with the diagnosis of RIGHT CAROTID ARTERY STENOSIS.  The various methods of treatment have been discussed with the patient and family. After consideration of risks, benefits and other options for treatment, the patient has consented to  Procedure(s): RIGHT CAROTID ENDARTERECTOMY (Right) as a surgical intervention.  The patient's history has been reviewed, patient examined, no change in status, stable for surgery.  I have reviewed the patient's chart and labs.  Questions were answered to the patient's satisfaction.     Gretta Began

## 2019-12-16 NOTE — Anesthesia Postprocedure Evaluation (Signed)
Anesthesia Post Note  Patient: Victor Cox  Procedure(s) Performed: RIGHT CAROTID ENDARTERECTOMY with PATCH ANGIOPLASTY (Right Neck)     Patient location during evaluation: PACU Anesthesia Type: General Level of consciousness: awake Pain management: pain level controlled Vital Signs Assessment: post-procedure vital signs reviewed and stable Respiratory status: spontaneous breathing Cardiovascular status: stable Postop Assessment: no apparent nausea or vomiting Anesthetic complications: no   No complications documented.  Last Vitals:  Vitals:   12/16/19 1200 12/16/19 1215  BP: (!) 114/54 117/68  Pulse: 73 72  Resp: 15 16  Temp:    SpO2: 93% 94%    Last Pain:  Vitals:   12/16/19 1200  PainSc: 0-No pain                 Roddy Bellamy

## 2019-12-17 ENCOUNTER — Encounter (HOSPITAL_COMMUNITY): Payer: Self-pay | Admitting: Vascular Surgery

## 2019-12-17 LAB — CBC
HCT: 35.9 % — ABNORMAL LOW (ref 39.0–52.0)
Hemoglobin: 11.1 g/dL — ABNORMAL LOW (ref 13.0–17.0)
MCH: 30.2 pg (ref 26.0–34.0)
MCHC: 30.9 g/dL (ref 30.0–36.0)
MCV: 97.8 fL (ref 80.0–100.0)
Platelets: 173 10*3/uL (ref 150–400)
RBC: 3.67 MIL/uL — ABNORMAL LOW (ref 4.22–5.81)
RDW: 13.7 % (ref 11.5–15.5)
WBC: 14.4 10*3/uL — ABNORMAL HIGH (ref 4.0–10.5)
nRBC: 0 % (ref 0.0–0.2)

## 2019-12-17 LAB — BASIC METABOLIC PANEL
Anion gap: 10 (ref 5–15)
BUN: 20 mg/dL (ref 8–23)
CO2: 24 mmol/L (ref 22–32)
Calcium: 8.1 mg/dL — ABNORMAL LOW (ref 8.9–10.3)
Chloride: 103 mmol/L (ref 98–111)
Creatinine, Ser: 1.6 mg/dL — ABNORMAL HIGH (ref 0.61–1.24)
GFR, Estimated: 46 mL/min — ABNORMAL LOW (ref >60)
Glucose, Bld: 123 mg/dL — ABNORMAL HIGH (ref 70–99)
Potassium: 4.8 mmol/L (ref 3.5–5.1)
Sodium: 137 mmol/L (ref 135–145)

## 2019-12-17 MED ORDER — ALBUTEROL SULFATE (2.5 MG/3ML) 0.083% IN NEBU: 2.5000 mg | INHALATION_SOLUTION | Freq: Four times a day (QID) | RESPIRATORY_TRACT | Status: DC | PRN

## 2019-12-17 MED ORDER — ATORVASTATIN CALCIUM 20 MG PO TABS: 20.0000 mg | ORAL_TABLET | Freq: Every day | ORAL | 11 refills | Status: AC

## 2019-12-17 MED ORDER — OXYCODONE-ACETAMINOPHEN 5-325 MG PO TABS
1.0000 | ORAL_TABLET | ORAL | 0 refills | Status: AC | PRN
Start: 2019-12-17 — End: ?

## 2019-12-17 NOTE — Plan of Care (Signed)
DISCHARGE NOTE HOME Victor Cox to be discharged home per MD order. Discussed prescriptions and follow up appointments with the patient. Medication list explained in detail. Patient verbalized understanding.  Skin clean, dry and intact without evidence of skin break down, no evidence of skin tears noted. IV catheter discontinued intact. Site without signs and symptoms of complications. Dressing and pressure applied. Pt denies pain at the site currently. No complaints noted.  Patient free of lines, drains, and wounds.   An After Visit Summary (AVS) was printed and given to the patient. Patient escorted via wheelchair, and discharged home via private auto.  Arlice Colt, RN

## 2019-12-17 NOTE — Progress Notes (Signed)
  Progress Note    12/17/2019 7:46 AM 1 Day Post-Op  Subjective:  Did not sleep well otherwise states he is feeling fine. Has not ambulated. Tolerating diet. Voiding without difficulty   Vitals:   12/17/19 0000 12/17/19 0442  BP: 139/68 (!) 150/67  Pulse: (!) 58 (!) 56  Resp: 15 16  Temp: 98.1 F (36.7 C) 98.1 F (36.7 C)  SpO2: 96% 96%   Physical Exam: Cardiac: regular Lungs: non labored Incisions: right neck incision clean, dry and intact. Minimal swelling. Soft Extremities:  Moving all extremities without deficits Neurologic: alert and oriented. CN intact. Tongue midline. Face symmetric. Speech Coherent  CBC    Component Value Date/Time   WBC 14.4 (H) 12/17/2019 0112   RBC 3.67 (L) 12/17/2019 0112   HGB 11.1 (L) 12/17/2019 0112   HCT 35.9 (L) 12/17/2019 0112   PLT 173 12/17/2019 0112   MCV 97.8 12/17/2019 0112   MCH 30.2 12/17/2019 0112   MCHC 30.9 12/17/2019 0112   RDW 13.7 12/17/2019 0112   LYMPHSABS 3.7 08/13/2010 1519   MONOABS 0.9 08/13/2010 1519   EOSABS 0.2 08/13/2010 1519   BASOSABS 0.0 08/13/2010 1519    BMET    Component Value Date/Time   NA 137 12/17/2019 0112   K 4.8 12/17/2019 0112   CL 103 12/17/2019 0112   CO2 24 12/17/2019 0112   GLUCOSE 123 (H) 12/17/2019 0112   BUN 20 12/17/2019 0112   CREATININE 1.60 (H) 12/17/2019 0112   CALCIUM 8.1 (L) 12/17/2019 0112   GFRNONAA 46 (L) 12/17/2019 0112   GFRAA >60 08/13/2010 1519    INR    Component Value Date/Time   INR 1.0 12/12/2019 1205     Intake/Output Summary (Last 24 hours) at 12/17/2019 0746 Last data filed at 12/16/2019 1800 Gross per 24 hour  Intake 2032.34 ml  Output 50 ml  Net 1982.34 ml     Assessment/Plan:  69 y.o. male is s/p right carotid endarterectomy 1 Day Post-Op. Patient remains neurologically intact. Tolerating diet. Voiding without difficulty. Will have him ambulate this morning to make sure he tolerates it okay. He will go home on his regular home medications.  He will continue Aspirin and statin. Otherwise he is stable for discharge home today. He will follow up in 2-3 weeks with Dr. Marlene Lard, PA-C [facility] [phone] 12/17/2019 7:46 AM

## 2019-12-17 NOTE — Discharge Summary (Signed)
Carotid Discharge Summary     Victor Cox 01/16/1951 69 y.o. male  [id number]  Admission Date: 12/16/2019  Discharge Date: 12/16/2019  Physician: No att. providers found  Admission Diagnosis: Asymptomatic carotid artery stenosis without infarction, right [facility]   Hospital Course:  The patient was admitted to the hospital and taken to the operating room on 12/16/2019 and underwent right carotid endarterectomy.  The pt tolerated the procedure well and was transported to the PACU in good condition.   By POD 1, the pt neuro status remained intact. Remained hemodynamically stable. Right neck incision clean, dry and intact without any swelling or hematoma.  The remainder of the hospital course consisted of increasing mobilization and increasing intake of solids without difficulty.  Stable for discharge home. He will resume home medications as prescribed. PDMP was reviewed and pain medication was sent to patients preferred pharmacy. He will follow up in 2-3 weeks with Dr. Arbie Cookey in the [facility] office   Recent Labs    12/17/19 0112  NA 137  K 4.8  CL 103  CO2 24  GLUCOSE 123*  BUN 20  CALCIUM 8.1*   Recent Labs    12/16/19 1450 12/17/19 0112  WBC 12.4* 14.4*  HGB 12.0* 11.1*  HCT 40.8 35.9*  PLT 166 173   No results for input(s): INR in the last 72 hours.   Discharge Instructions    Discharge patient   Complete by: As directed    Discharge disposition: 01-Home or Self Care   Discharge patient date: 12/17/2019      Discharge Diagnosis:  Asymptomatic carotid artery stenosis without infarction, right [I65.21]  Secondary Diagnosis: Patient Active Problem List   Diagnosis Date Noted  . Asymptomatic carotid artery stenosis without infarction, right 12/16/2019  . COPD (chronic obstructive pulmonary disease) (HCC) 09/01/2010  . History of kidney stones 09/01/2010  . Spontaneous pneumothorax 09/01/2010   Past Medical History:  Diagnosis Date  . CHF  (congestive heart failure) (HCC)   . COPD (chronic obstructive pulmonary disease) (HCC)   . Dyspnea   . History of kidney stones   . Pneumonia   . Pneumothorax, right 08/13/2010   with complete collapse of the right lung    Allergies as of 12/17/2019   No Known Allergies     Medication List    TAKE these medications   acetaminophen 325 MG tablet Commonly known as: TYLENOL Take 650 mg by mouth every 6 (six) hours as needed for moderate pain or headache.   albuterol (2.5 MG/3ML) 0.083% nebulizer solution Commonly known as: PROVENTIL Take 2.5 mg by nebulization 2 (two) times daily.   aspirin EC 81 MG tablet Take 81 mg by mouth daily. Swallow whole.   atorvastatin 20 MG tablet Commonly known as: Lipitor Take 1 tablet (20 mg total) by mouth daily.   losartan 50 MG tablet Commonly known as: COZAAR Take 50 mg by mouth daily.   metoprolol succinate 25 MG 24 hr tablet Commonly known as: TOPROL-XL Take 25 mg by mouth daily.   oxyCODONE-acetaminophen 5-325 MG tablet Commonly known as: PERCOCET/ROXICET Take 1-2 tablets by mouth every 4 (four) hours as needed for moderate pain.   spironolactone 25 MG tablet Commonly known as: ALDACTONE Take 12.5 mg by mouth daily.        Discharge Instructions:   [facility] Discharge Instructions Carotid Endarterectomy (CEA)  Please refer to the following instructions for your post-procedure care. Your surgeon or physician assistant will discuss any changes with you.  Activity  You are encouraged to walk as much as you can. You can slowly return to normal activities but must avoid strenuous activity and heavy lifting until your doctor tell you it's OK. Avoid activities such as vacuuming or swinging a golf club. You can drive after one week if you are comfortable and you are no longer taking prescription pain medications. It is normal to feel tired for serval weeks after your surgery. It is also normal to  have difficulty with sleep habits, eating, and bowel movements after surgery. These will go away with time.  Bathing/Showering  You may shower after you come home. Do not soak in a bathtub, hot tub, or swim until the incision heals completely.  Incision Care  Shower every day. Clean your incision with mild soap and water. Pat the area dry with a clean towel. You do not need a bandage unless otherwise instructed. Do not apply any ointments or creams to your incision. You may have skin glue on your incision. Do not peel it off. It will come off on its own in about one week. Your incision may feel thickened and raised for several weeks after your surgery. This is normal and the skin will soften over time. For Men Only: It's OK to shave around the incision but do not shave the incision itself for 2 weeks. It is common to have numbness under your chin that could last for several months.  Diet  Resume your normal diet. There are no special food restrictions following this procedure. A low fat/low cholesterol diet is recommended for all patients with vascular disease. In order to heal from your surgery, it is CRITICAL to get adequate nutrition. Your body requires vitamins, minerals, and protein. Vegetables are the best source of vitamins and minerals. Vegetables also provide the perfect balance of protein. Processed food has little nutritional value, so try to avoid this.  Medications  Resume taking all of your medications unless your doctor or physician assistant tells you not to.  If your incision is causing pain, you may take over-the- counter pain relievers such as acetaminophen (Tylenol). If you were prescribed a stronger pain medication, please be aware these medications can cause nausea and constipation.  Prevent nausea by taking the medication with a snack or meal. Avoid constipation by drinking plenty of fluids and eating foods with a high amount of fiber, such as fruits, vegetables, and grains. Do  not take Tylenol if you are taking prescription pain medications.  Follow Up  Our office will schedule a follow up appointment 2-3 weeks following discharge.  Please call us immediately for any of the following conditions  . Increased pain, redness, drainage (pus) from your incision site. . Fever of 101 degrees or higher. . If you should develop stroke (slurred speech, difficulty swallowing, weakness on one side of your body, loss of vision) you should call 911 and go to the nearest emergency room. .  Reduce your risk of vascular disease:  . Stop smoking. If you would like help call [facility] at 1-800-QUIT-NOW (317-363-8029) or [place] at 4185673159. . Manage your cholesterol . Maintain a desired weight . Control your diabetes . Keep your blood pressure down .  If you have any questions, please call the office at [phone].  Prescriptions given: Oxycodone- Acetaminophen 5-325 mg #15 No Refill  Disposition: Home  Patient's condition: is Good  Follow up: 1. Dr. Arbie Cookey in 2 weeks.   Graceann Congress PA-C [facility] [phone]   ---  For [employer] Registry use ---   Modified Rankin score at D/C (0-6): 0  IV medication needed for:  1. Hypertension: No 2. Hypotension: No  Post-op Complications: No  1. Post-op CVA or TIA: No  If yes: Event classification (right eye, left eye, right cortical, left cortical, verterobasilar, other): n/a  If yes: Timing of event (intra-op, <6 hrs post-op, >=6 hrs post-op, unknown): n/a  2. CN injury: No  If yes: CN not injuried   3. Myocardial infarction: No  If yes: Dx by (EKG or clinical, Troponin): n/a  4.  CHF: No  5.  Dysrhythmia (new): No  6. Wound infection: No  7. Reperfusion symptoms: No  8. Return to OR: No  If yes: return to OR for (bleeding, neurologic, other CEA incision, other): n/a  Discharge medications: Statin use:  Yes ASA use:  Yes   Beta blocker use:  Yes ACE-Inhibitor use:   No  ARB use:  Yes CCB use: No P2Y12 Antagonist use: No, [ ]  Plavix, [ ]  Plasugrel, [ ]  Ticlopinine, [ ]  Ticagrelor, [ ]  Other, [ ]  No for medical reason, [ ]  Non-compliant, [ ]  Not-indicated Anti-coagulant use:  No, [ ]  Warfarin, [ ]  Rivaroxaban, [ ]  Dabigatran,

## 2019-12-17 NOTE — Progress Notes (Signed)
Right radial arterial line d/c'd per order, pressure held 5 minutes x 2, hematoma noted. Pressure dressing applied palpable radial noted. Reported to Day RN who notified rounding PA.

## 2019-12-17 NOTE — Progress Notes (Signed)
Right radial arterial line removed by night shift RN, pressure held, swelling developed at site, [employer] PA was notified.

## 2019-12-22 ENCOUNTER — Other Ambulatory Visit: Payer: Self-pay | Admitting: *Deleted

## 2019-12-22 NOTE — Patient Outreach (Signed)
[facility]) Care Management  12/22/2019  Victor Cox 1950/09/03 [id number]   EMMI-GENERAL DISCHARGE-RESOLVED RED ON EMMI ALERT Day #1 Date:12/19/2019 Red Alert Reason:NO FOLLOW UP APPOINTMENT    OUTREACH #1 RN spoke with pt concerning the above EMMI. Pt has his discharge papers and aware Dr. Bosie Helper office will call to verified an appointment date. Pt encouraged to call the office directly if no call has taken place by that timeframe. No addition needs at this time.  Will close this case with no additional needs.  Victor Cousin, RN Care Management Coordinator [facility] Main Office [phone]

## 2019-12-29 DIAGNOSIS — Z09 Encounter for follow-up examination after completed treatment for conditions other than malignant neoplasm: Secondary | ICD-10-CM | POA: Diagnosis not present

## 2019-12-29 DIAGNOSIS — I429 Cardiomyopathy, unspecified: Secondary | ICD-10-CM | POA: Diagnosis not present

## 2019-12-29 DIAGNOSIS — Z299 Encounter for prophylactic measures, unspecified: Secondary | ICD-10-CM | POA: Diagnosis not present

## 2019-12-29 DIAGNOSIS — I779 Disorder of arteries and arterioles, unspecified: Secondary | ICD-10-CM | POA: Diagnosis not present

## 2019-12-29 DIAGNOSIS — J449 Chronic obstructive pulmonary disease, unspecified: Secondary | ICD-10-CM | POA: Diagnosis not present

## 2019-12-29 DIAGNOSIS — I10 Essential (primary) hypertension: Secondary | ICD-10-CM | POA: Diagnosis not present

## 2020-01-15 DIAGNOSIS — I10 Essential (primary) hypertension: Secondary | ICD-10-CM | POA: Diagnosis not present

## 2020-01-15 DIAGNOSIS — J449 Chronic obstructive pulmonary disease, unspecified: Secondary | ICD-10-CM | POA: Diagnosis not present

## 2020-03-19 DIAGNOSIS — Z87891 Personal history of nicotine dependence: Secondary | ICD-10-CM | POA: Diagnosis not present

## 2020-03-19 DIAGNOSIS — Z6825 Body mass index (BMI) 25.0-25.9, adult: Secondary | ICD-10-CM | POA: Diagnosis not present

## 2020-03-19 DIAGNOSIS — Z1331 Encounter for screening for depression: Secondary | ICD-10-CM | POA: Diagnosis not present

## 2020-03-19 DIAGNOSIS — Z79899 Other long term (current) drug therapy: Secondary | ICD-10-CM | POA: Diagnosis not present

## 2020-03-19 DIAGNOSIS — Z299 Encounter for prophylactic measures, unspecified: Secondary | ICD-10-CM | POA: Diagnosis not present

## 2020-03-19 DIAGNOSIS — E7800 Pure hypercholesterolemia, unspecified: Secondary | ICD-10-CM | POA: Diagnosis not present

## 2020-03-19 DIAGNOSIS — Z7189 Other specified counseling: Secondary | ICD-10-CM | POA: Diagnosis not present

## 2020-03-19 DIAGNOSIS — I10 Essential (primary) hypertension: Secondary | ICD-10-CM | POA: Diagnosis not present

## 2020-03-19 DIAGNOSIS — R5383 Other fatigue: Secondary | ICD-10-CM | POA: Diagnosis not present

## 2020-03-19 DIAGNOSIS — Z1339 Encounter for screening examination for other mental health and behavioral disorders: Secondary | ICD-10-CM | POA: Diagnosis not present

## 2020-03-19 DIAGNOSIS — Z125 Encounter for screening for malignant neoplasm of prostate: Secondary | ICD-10-CM | POA: Diagnosis not present

## 2020-03-19 DIAGNOSIS — Z0000 Encounter for general adult medical examination without abnormal findings: Secondary | ICD-10-CM | POA: Diagnosis not present

## 2020-06-21 DIAGNOSIS — I779 Disorder of arteries and arterioles, unspecified: Secondary | ICD-10-CM | POA: Diagnosis not present

## 2020-06-21 DIAGNOSIS — Z299 Encounter for prophylactic measures, unspecified: Secondary | ICD-10-CM | POA: Diagnosis not present

## 2020-06-21 DIAGNOSIS — I10 Essential (primary) hypertension: Secondary | ICD-10-CM | POA: Diagnosis not present

## 2020-06-21 DIAGNOSIS — I429 Cardiomyopathy, unspecified: Secondary | ICD-10-CM | POA: Diagnosis not present

## 2020-06-21 DIAGNOSIS — J449 Chronic obstructive pulmonary disease, unspecified: Secondary | ICD-10-CM | POA: Diagnosis not present

## 2020-07-15 DIAGNOSIS — J449 Chronic obstructive pulmonary disease, unspecified: Secondary | ICD-10-CM | POA: Diagnosis not present

## 2020-07-15 DIAGNOSIS — I10 Essential (primary) hypertension: Secondary | ICD-10-CM | POA: Diagnosis not present

## 2020-07-15 DIAGNOSIS — E7800 Pure hypercholesterolemia, unspecified: Secondary | ICD-10-CM | POA: Diagnosis not present

## 2020-07-15 DIAGNOSIS — I429 Cardiomyopathy, unspecified: Secondary | ICD-10-CM | POA: Diagnosis not present

## 2020-08-15 DIAGNOSIS — E7800 Pure hypercholesterolemia, unspecified: Secondary | ICD-10-CM | POA: Diagnosis not present

## 2020-08-15 DIAGNOSIS — I429 Cardiomyopathy, unspecified: Secondary | ICD-10-CM | POA: Diagnosis not present

## 2020-08-15 DIAGNOSIS — I10 Essential (primary) hypertension: Secondary | ICD-10-CM | POA: Diagnosis not present

## 2020-08-15 DIAGNOSIS — J449 Chronic obstructive pulmonary disease, unspecified: Secondary | ICD-10-CM | POA: Diagnosis not present

## 2020-10-15 DIAGNOSIS — I429 Cardiomyopathy, unspecified: Secondary | ICD-10-CM | POA: Diagnosis not present

## 2020-10-15 DIAGNOSIS — I10 Essential (primary) hypertension: Secondary | ICD-10-CM | POA: Diagnosis not present

## 2020-11-22 DIAGNOSIS — J069 Acute upper respiratory infection, unspecified: Secondary | ICD-10-CM | POA: Diagnosis not present

## 2020-11-22 DIAGNOSIS — D692 Other nonthrombocytopenic purpura: Secondary | ICD-10-CM | POA: Diagnosis not present

## 2020-11-22 DIAGNOSIS — I10 Essential (primary) hypertension: Secondary | ICD-10-CM | POA: Diagnosis not present

## 2020-11-22 DIAGNOSIS — Z299 Encounter for prophylactic measures, unspecified: Secondary | ICD-10-CM | POA: Diagnosis not present

## 2020-11-22 DIAGNOSIS — J449 Chronic obstructive pulmonary disease, unspecified: Secondary | ICD-10-CM | POA: Diagnosis not present

## 2021-01-07 DIAGNOSIS — R5381 Other malaise: Secondary | ICD-10-CM | POA: Diagnosis not present

## 2021-01-07 DIAGNOSIS — N179 Acute kidney failure, unspecified: Secondary | ICD-10-CM | POA: Diagnosis not present

## 2021-01-07 DIAGNOSIS — E785 Hyperlipidemia, unspecified: Secondary | ICD-10-CM | POA: Diagnosis not present

## 2021-01-07 DIAGNOSIS — I951 Orthostatic hypotension: Secondary | ICD-10-CM | POA: Diagnosis not present

## 2021-01-07 DIAGNOSIS — J189 Pneumonia, unspecified organism: Secondary | ICD-10-CM | POA: Diagnosis not present

## 2021-01-07 DIAGNOSIS — R55 Syncope and collapse: Secondary | ICD-10-CM | POA: Diagnosis not present

## 2021-01-07 DIAGNOSIS — J181 Lobar pneumonia, unspecified organism: Secondary | ICD-10-CM | POA: Diagnosis not present

## 2021-01-07 DIAGNOSIS — D72829 Elevated white blood cell count, unspecified: Secondary | ICD-10-CM | POA: Diagnosis not present

## 2021-01-07 DIAGNOSIS — N390 Urinary tract infection, site not specified: Secondary | ICD-10-CM | POA: Diagnosis not present

## 2021-01-07 DIAGNOSIS — I10 Essential (primary) hypertension: Secondary | ICD-10-CM | POA: Diagnosis not present

## 2021-01-07 DIAGNOSIS — J449 Chronic obstructive pulmonary disease, unspecified: Secondary | ICD-10-CM | POA: Diagnosis not present

## 2021-01-08 DIAGNOSIS — R55 Syncope and collapse: Secondary | ICD-10-CM | POA: Diagnosis not present

## 2021-01-08 DIAGNOSIS — N390 Urinary tract infection, site not specified: Secondary | ICD-10-CM | POA: Diagnosis not present

## 2021-01-14 DIAGNOSIS — I429 Cardiomyopathy, unspecified: Secondary | ICD-10-CM | POA: Diagnosis not present

## 2021-01-14 DIAGNOSIS — J449 Chronic obstructive pulmonary disease, unspecified: Secondary | ICD-10-CM | POA: Diagnosis not present

## 2021-01-14 DIAGNOSIS — Z09 Encounter for follow-up examination after completed treatment for conditions other than malignant neoplasm: Secondary | ICD-10-CM | POA: Diagnosis not present

## 2021-01-14 DIAGNOSIS — I10 Essential (primary) hypertension: Secondary | ICD-10-CM | POA: Diagnosis not present

## 2021-01-14 DIAGNOSIS — R55 Syncope and collapse: Secondary | ICD-10-CM | POA: Diagnosis not present

## 2021-03-21 DIAGNOSIS — Z1339 Encounter for screening examination for other mental health and behavioral disorders: Secondary | ICD-10-CM | POA: Diagnosis not present

## 2021-03-21 DIAGNOSIS — J449 Chronic obstructive pulmonary disease, unspecified: Secondary | ICD-10-CM | POA: Diagnosis not present

## 2021-03-21 DIAGNOSIS — Z299 Encounter for prophylactic measures, unspecified: Secondary | ICD-10-CM | POA: Diagnosis not present

## 2021-03-21 DIAGNOSIS — Z79899 Other long term (current) drug therapy: Secondary | ICD-10-CM | POA: Diagnosis not present

## 2021-03-21 DIAGNOSIS — E7800 Pure hypercholesterolemia, unspecified: Secondary | ICD-10-CM | POA: Diagnosis not present

## 2021-03-21 DIAGNOSIS — Z7189 Other specified counseling: Secondary | ICD-10-CM | POA: Diagnosis not present

## 2021-03-21 DIAGNOSIS — Z789 Other specified health status: Secondary | ICD-10-CM | POA: Diagnosis not present

## 2021-03-21 DIAGNOSIS — Z125 Encounter for screening for malignant neoplasm of prostate: Secondary | ICD-10-CM | POA: Diagnosis not present

## 2021-03-21 DIAGNOSIS — Z1331 Encounter for screening for depression: Secondary | ICD-10-CM | POA: Diagnosis not present

## 2021-03-21 DIAGNOSIS — Z6825 Body mass index (BMI) 25.0-25.9, adult: Secondary | ICD-10-CM | POA: Diagnosis not present

## 2021-03-21 DIAGNOSIS — R5383 Other fatigue: Secondary | ICD-10-CM | POA: Diagnosis not present

## 2021-03-21 DIAGNOSIS — I10 Essential (primary) hypertension: Secondary | ICD-10-CM | POA: Diagnosis not present

## 2021-03-21 DIAGNOSIS — Z0000 Encounter for general adult medical examination without abnormal findings: Secondary | ICD-10-CM | POA: Diagnosis not present

## 2021-04-05 DIAGNOSIS — Z1212 Encounter for screening for malignant neoplasm of rectum: Secondary | ICD-10-CM | POA: Diagnosis not present

## 2021-04-05 DIAGNOSIS — Z1211 Encounter for screening for malignant neoplasm of colon: Secondary | ICD-10-CM | POA: Diagnosis not present

## 2021-04-14 DIAGNOSIS — J449 Chronic obstructive pulmonary disease, unspecified: Secondary | ICD-10-CM | POA: Diagnosis not present

## 2021-05-15 DIAGNOSIS — J449 Chronic obstructive pulmonary disease, unspecified: Secondary | ICD-10-CM | POA: Diagnosis not present

## 2021-06-14 DIAGNOSIS — J449 Chronic obstructive pulmonary disease, unspecified: Secondary | ICD-10-CM | POA: Diagnosis not present

## 2021-06-28 DIAGNOSIS — I10 Essential (primary) hypertension: Secondary | ICD-10-CM | POA: Diagnosis not present

## 2021-06-28 DIAGNOSIS — I779 Disorder of arteries and arterioles, unspecified: Secondary | ICD-10-CM | POA: Diagnosis not present

## 2021-06-28 DIAGNOSIS — Z299 Encounter for prophylactic measures, unspecified: Secondary | ICD-10-CM | POA: Diagnosis not present

## 2021-06-28 DIAGNOSIS — J9611 Chronic respiratory failure with hypoxia: Secondary | ICD-10-CM | POA: Diagnosis not present

## 2021-06-28 DIAGNOSIS — Z6827 Body mass index (BMI) 27.0-27.9, adult: Secondary | ICD-10-CM | POA: Diagnosis not present

## 2021-06-28 DIAGNOSIS — J449 Chronic obstructive pulmonary disease, unspecified: Secondary | ICD-10-CM | POA: Diagnosis not present

## 2021-07-04 DIAGNOSIS — I428 Other cardiomyopathies: Secondary | ICD-10-CM | POA: Diagnosis not present

## 2021-07-14 DIAGNOSIS — J449 Chronic obstructive pulmonary disease, unspecified: Secondary | ICD-10-CM | POA: Diagnosis not present

## 2021-11-09 DIAGNOSIS — J449 Chronic obstructive pulmonary disease, unspecified: Secondary | ICD-10-CM | POA: Diagnosis not present

## 2021-11-09 DIAGNOSIS — J9611 Chronic respiratory failure with hypoxia: Secondary | ICD-10-CM | POA: Diagnosis not present

## 2021-11-09 DIAGNOSIS — Z299 Encounter for prophylactic measures, unspecified: Secondary | ICD-10-CM | POA: Diagnosis not present

## 2021-11-09 DIAGNOSIS — I429 Cardiomyopathy, unspecified: Secondary | ICD-10-CM | POA: Diagnosis not present

## 2021-11-09 DIAGNOSIS — I10 Essential (primary) hypertension: Secondary | ICD-10-CM | POA: Diagnosis not present

## 2021-11-14 DIAGNOSIS — J449 Chronic obstructive pulmonary disease, unspecified: Secondary | ICD-10-CM | POA: Diagnosis not present

## 2022-03-27 DIAGNOSIS — Z1339 Encounter for screening examination for other mental health and behavioral disorders: Secondary | ICD-10-CM | POA: Diagnosis not present

## 2022-03-27 DIAGNOSIS — Z125 Encounter for screening for malignant neoplasm of prostate: Secondary | ICD-10-CM | POA: Diagnosis not present

## 2022-03-27 DIAGNOSIS — J449 Chronic obstructive pulmonary disease, unspecified: Secondary | ICD-10-CM | POA: Diagnosis not present

## 2022-03-27 DIAGNOSIS — Z6828 Body mass index (BMI) 28.0-28.9, adult: Secondary | ICD-10-CM | POA: Diagnosis not present

## 2022-03-27 DIAGNOSIS — Z87891 Personal history of nicotine dependence: Secondary | ICD-10-CM | POA: Diagnosis not present

## 2022-03-27 DIAGNOSIS — E7800 Pure hypercholesterolemia, unspecified: Secondary | ICD-10-CM | POA: Diagnosis not present

## 2022-03-27 DIAGNOSIS — Z0000 Encounter for general adult medical examination without abnormal findings: Secondary | ICD-10-CM | POA: Diagnosis not present

## 2022-03-27 DIAGNOSIS — Z1331 Encounter for screening for depression: Secondary | ICD-10-CM | POA: Diagnosis not present

## 2022-03-27 DIAGNOSIS — I10 Essential (primary) hypertension: Secondary | ICD-10-CM | POA: Diagnosis not present

## 2022-03-27 DIAGNOSIS — Z7189 Other specified counseling: Secondary | ICD-10-CM | POA: Diagnosis not present

## 2022-03-27 DIAGNOSIS — R5383 Other fatigue: Secondary | ICD-10-CM | POA: Diagnosis not present

## 2022-03-27 DIAGNOSIS — Z299 Encounter for prophylactic measures, unspecified: Secondary | ICD-10-CM | POA: Diagnosis not present

## 2022-03-27 DIAGNOSIS — Z79899 Other long term (current) drug therapy: Secondary | ICD-10-CM | POA: Diagnosis not present

## 2022-03-27 DIAGNOSIS — I429 Cardiomyopathy, unspecified: Secondary | ICD-10-CM | POA: Diagnosis not present

## 2022-07-22 IMAGING — US US CAROTID DUPLEX BILAT
1 series · 13 of 24 positions shown · non-contrast
Comparison: Report only 01/23/2017

CLINICAL DATA: 69-year-old male with a history of bilateral carotid
atherosclerosis

EXAM:
BILATERAL CAROTID DUPLEX ULTRASOUND
TECHNIQUE: Gray scale imaging, color Doppler and duplex ultrasound were
performed of bilateral carotid and vertebral arteries in the neck.

[Series 1: us carotid bilateral · 13 of 76 slices shown]
[im 1/76]
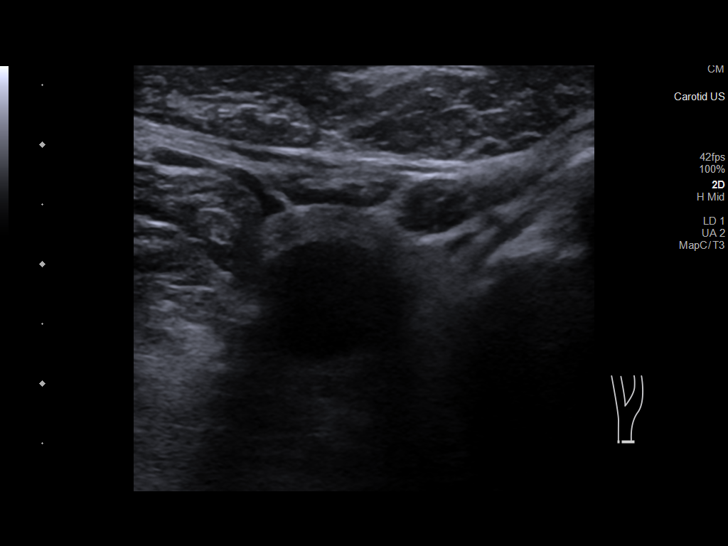
[im 7/76]
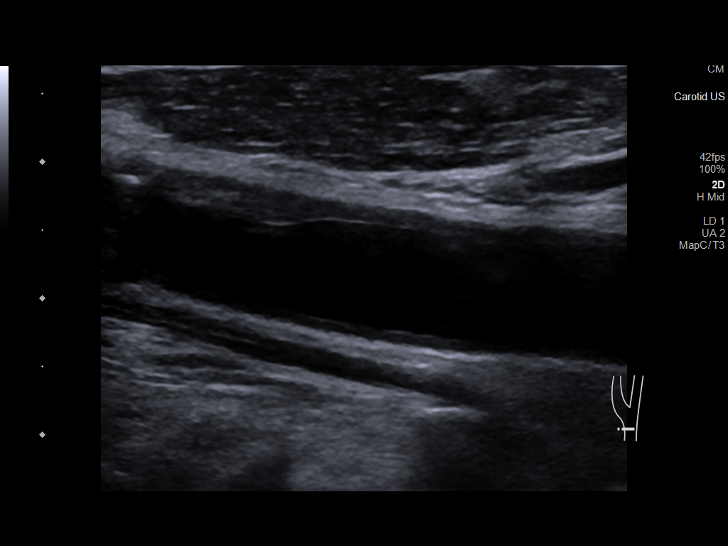
[im 14/76]
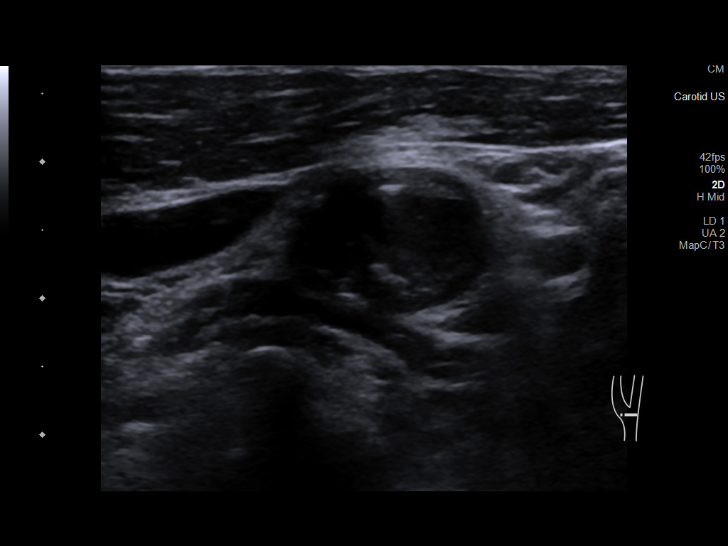
[im 20/76]
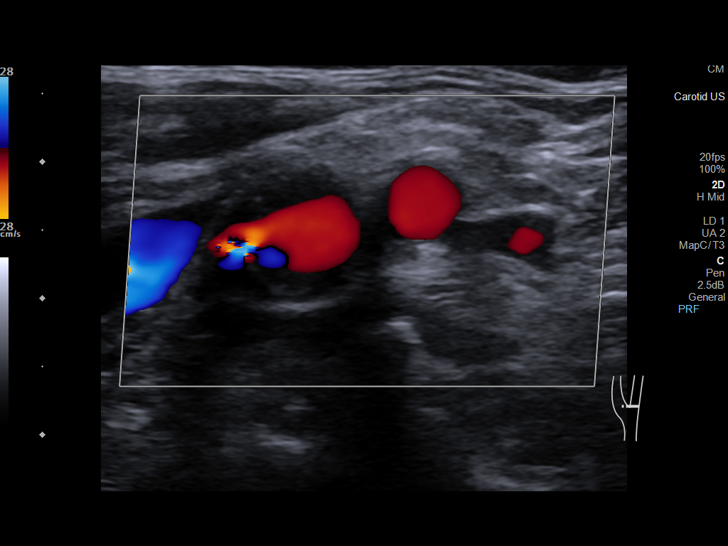
[im 27/76]
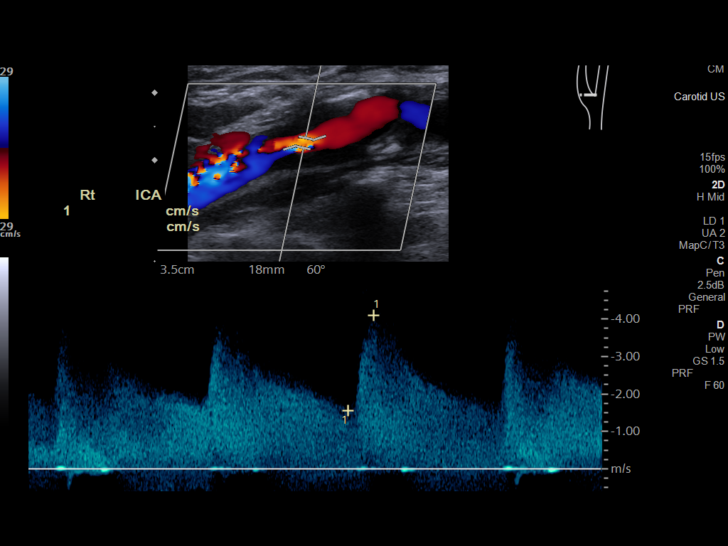
[im 33/76]
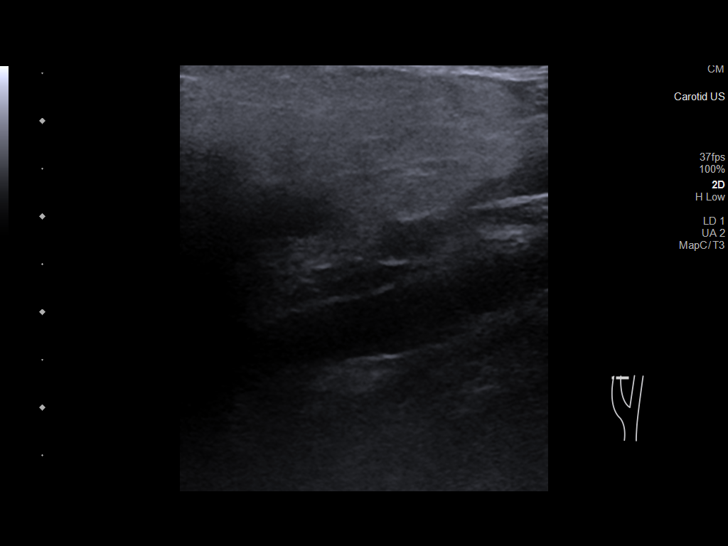
[im 40/76]
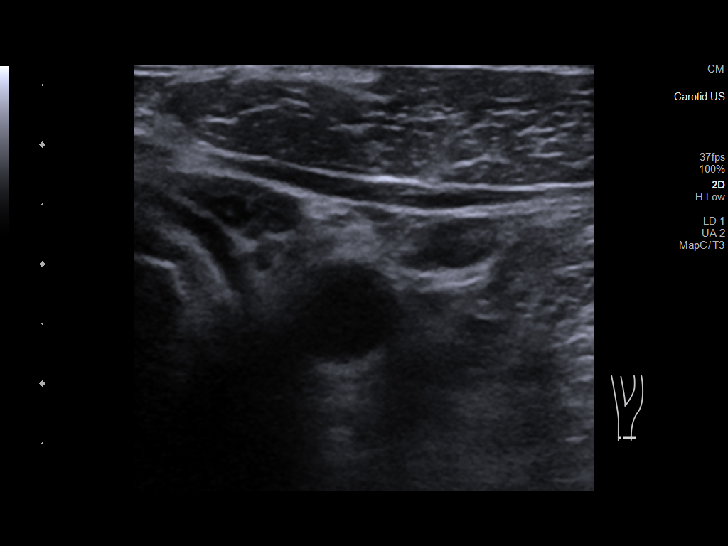
[im 43/76]
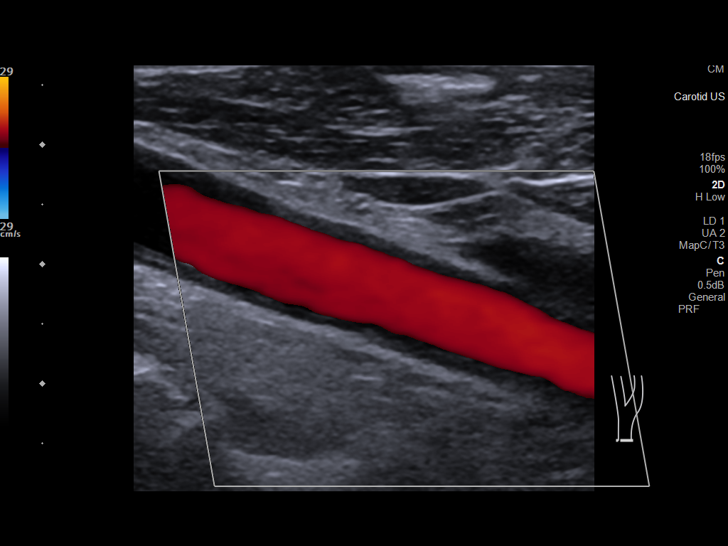
[im 49/76]
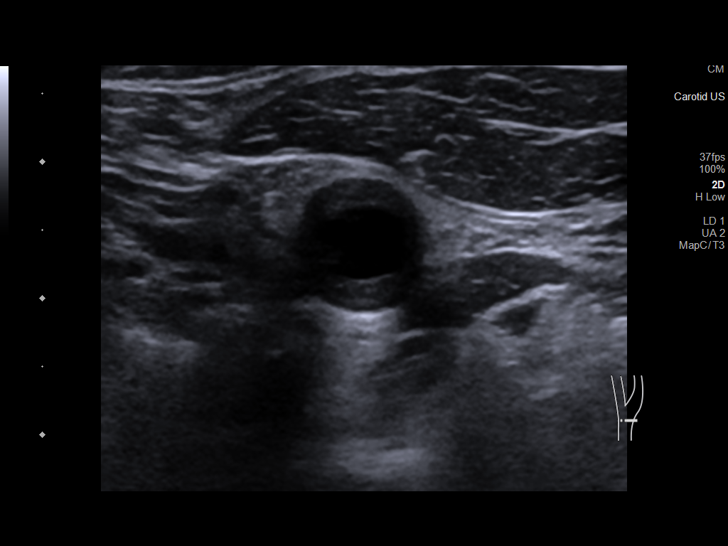
[im 56/76]
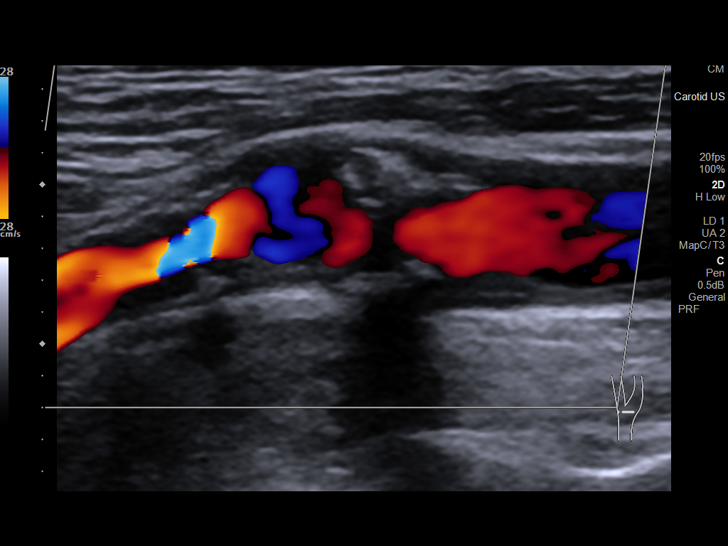
[im 62/76]
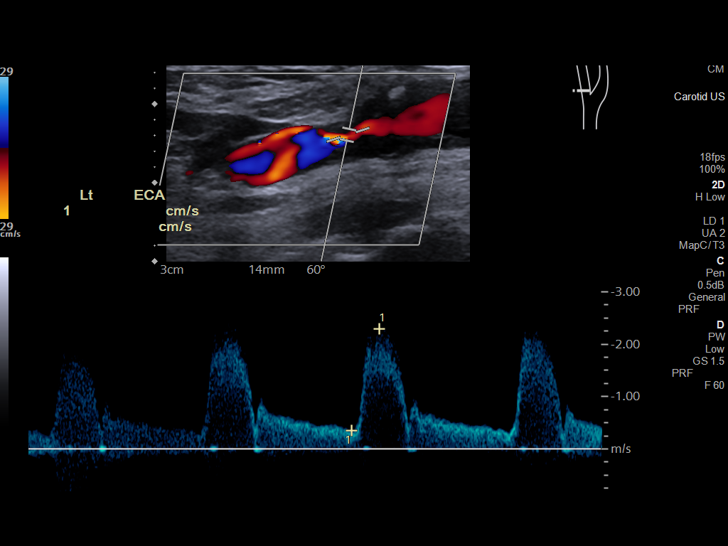
[im 69/76]
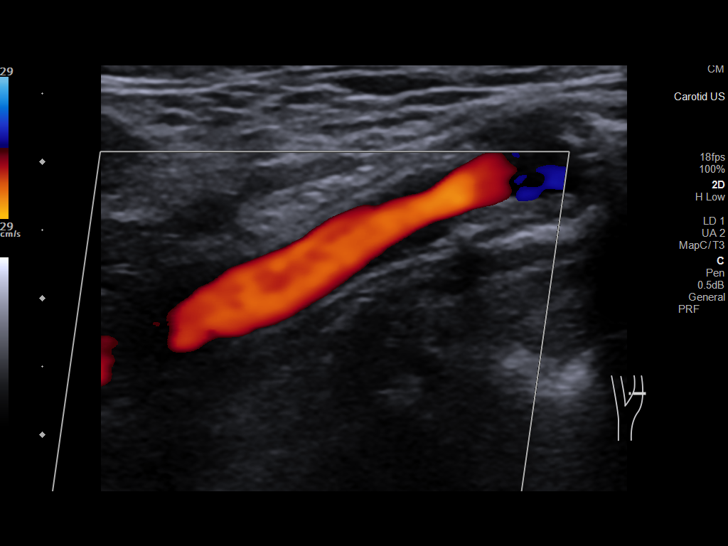
[im 76/76]
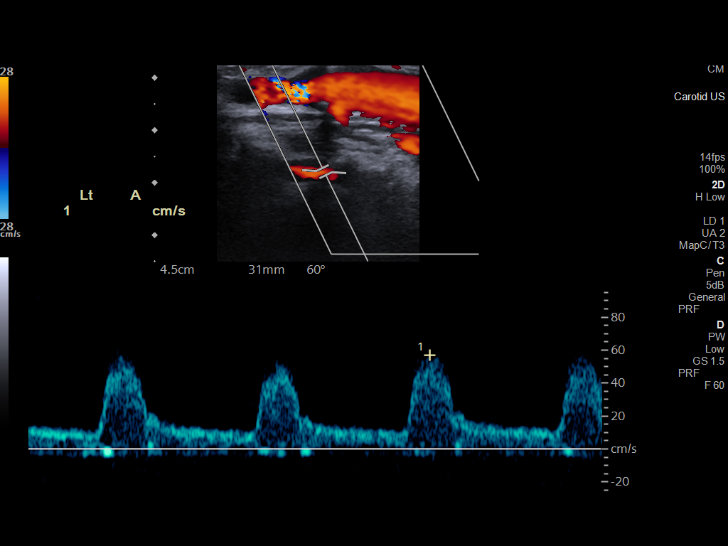

[13 of 24 positions shown; findings below may reference images not displayed]

FINDINGS: Criteria: Quantification of carotid stenosis is based on velocity
parameters that correlate the residual internal carotid diameter
with NASCET-based stenosis levels, using the diameter of the distal
internal carotid lumen as the denominator for stenosis measurement.

The following velocity measurements were obtained:

RIGHT

ICA:  Systolic 410 cm/sec, Diastolic 156 cm/sec

CCA:  51 cm/sec

SYSTOLIC ICA/CCA RATIO:

ECA:  158 cm/sec

LEFT

ICA:  Systolic 157 cm/sec, Diastolic 51 cm/sec

CCA:  52 cm/sec

SYSTOLIC ICA/CCA RATIO:

ECA:  229 cm/sec

Right Brachial SBP: Not acquired

Left Brachial SBP: Not acquired

RIGHT CAROTID ARTERY: No significant calcifications of the right
common carotid artery. Intermediate waveform maintained. Moderate
heterogeneous and partially calcified plaque at the right carotid
bifurcation. Mild lumen shadowing. Low resistance waveform of the
right ICA, with developing tardus parvus waveform, and spectral
broadening at the sites of velocity measurement. No significant
tortuosity.

RIGHT VERTEBRAL ARTERY: Antegrade flow with low resistance waveform.

LEFT CAROTID ARTERY: No significant calcifications of the left
common carotid artery. Intermediate waveform maintained. Moderate
heterogeneous and partially calcified plaque at the left carotid
bifurcation. Lumen shadowing present. Low resistance waveform of the
left ICA. No significant tortuosity.

LEFT VERTEBRAL ARTERY:  Antegrade flow with low resistance waveform.
IMPRESSION: Right:

Heterogeneous and partially calcified plaque at the right carotid
bifurcation contributes to 70%-99% stenosis by established duplex
criteria. Stenosis is likely on the higher end of the spectrum,
given the developing tardus parvus waveform.

Left:

Heterogeneous and partially calcified plaque at the left carotid
bifurcation contributes to 50%-69% stenosis by established duplex
criteria. Note that the flow velocities of the left ICA were
obtained from an area distal to the maximum narrowing due to the
presence of anterior wall plaque with shadowing (image 67) and may
be underestimating the percentage of ICA stenosis.

## 2022-08-02 DIAGNOSIS — J449 Chronic obstructive pulmonary disease, unspecified: Secondary | ICD-10-CM | POA: Diagnosis not present

## 2022-08-02 DIAGNOSIS — I10 Essential (primary) hypertension: Secondary | ICD-10-CM | POA: Diagnosis not present

## 2022-08-02 DIAGNOSIS — J9611 Chronic respiratory failure with hypoxia: Secondary | ICD-10-CM | POA: Diagnosis not present

## 2022-08-02 DIAGNOSIS — E261 Secondary hyperaldosteronism: Secondary | ICD-10-CM | POA: Diagnosis not present

## 2022-08-02 DIAGNOSIS — I429 Cardiomyopathy, unspecified: Secondary | ICD-10-CM | POA: Diagnosis not present

## 2022-08-02 DIAGNOSIS — Z299 Encounter for prophylactic measures, unspecified: Secondary | ICD-10-CM | POA: Diagnosis not present

## 2022-08-06 IMAGING — CT CT ANGIO NECK
2 of 12 series · 6 of 33 positions shown · IV contrast (omnipaque)
Comparison: 11/12/2019 carotid duplex ultrasound.

CLINICAL DATA: Carotid stenosis screening.  ICA stenosis.

EXAM:
CT ANGIOGRAPHY HEAD AND NECK
TECHNIQUE: Multidetector CT imaging of the head and neck was performed using
the standard protocol during bolus administration of intravenous
contrast. Multiplanar CT image reconstructions and MIPs were
obtained to evaluate the vascular anatomy. Carotid stenosis
measurements (when applicable) are obtained utilizing NASCET
criteria, using the distal internal carotid diameter as the
denominator.
CONTRAST:  75mL OMNIPAQUE IOHEXOL 350 MG/ML SOLN

[Series 10: cta head & neck · axial · 0.45mm/px · z∈[+80,+296]mm · 4 of 721 slices shown]
[im 145/721  soft-tissue]
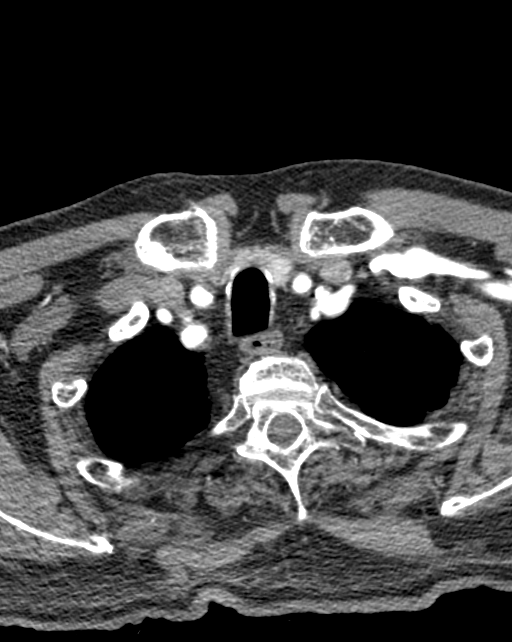
[im 289/721  bone]
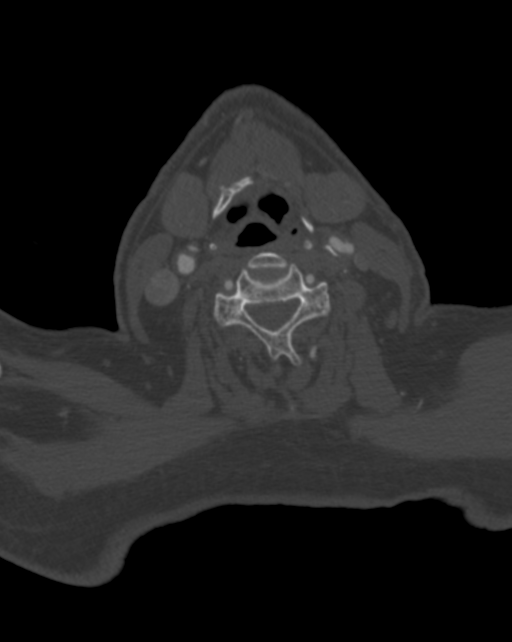
[im 433/721  soft-tissue]
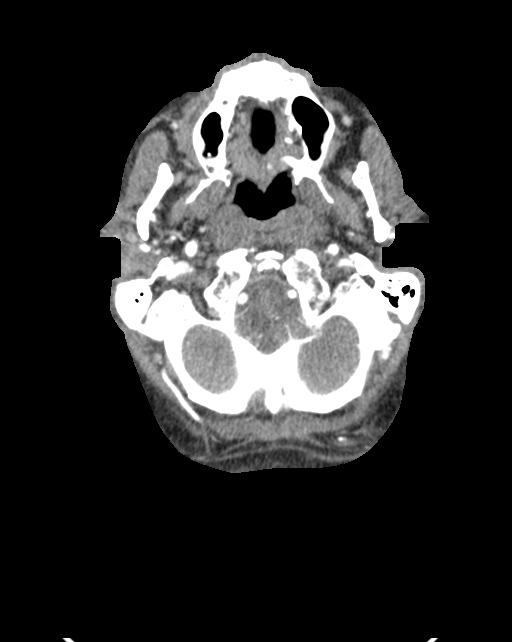
[im 577/721  bone]
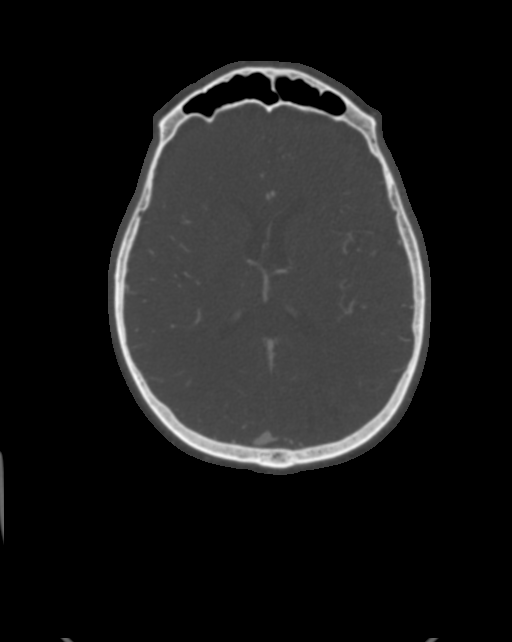

[Series 11: ax thins · axial · 0.43mm/px · z∈[+128,+248]mm · 2 of 361 slices shown]
[im 121/361  soft-tissue]
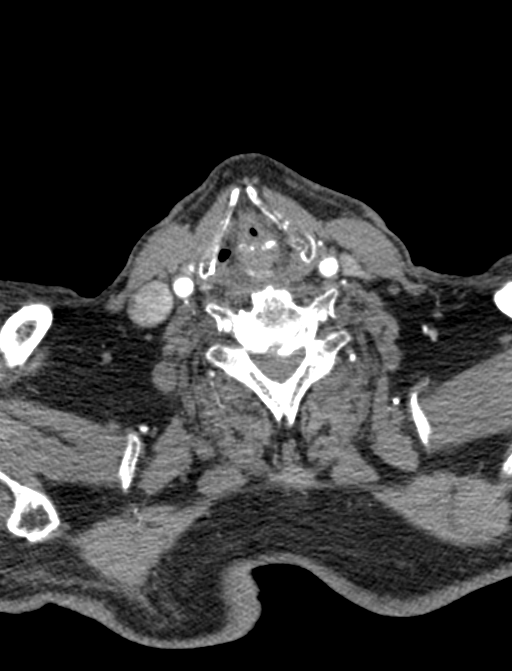
[im 241/361  soft-tissue]
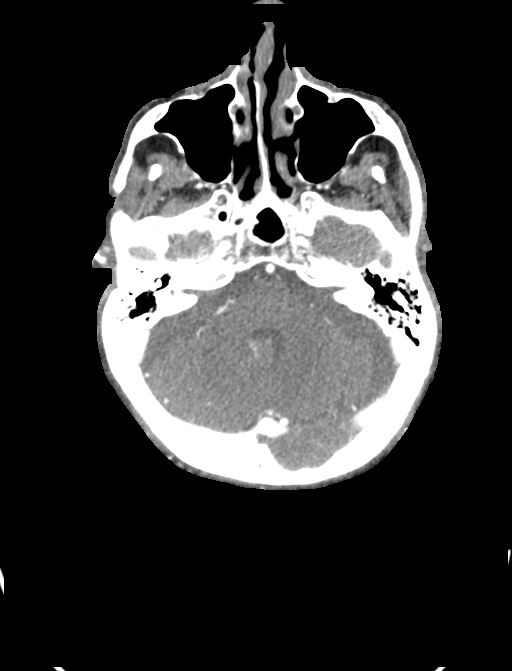

[6 of 33 positions shown; findings below may reference images not displayed]

FINDINGS: CT HEAD FINDINGS

Brain: Left parietal hypodensity with encephalomalacia, likely
sequela of remote insult. No intracranial hemorrhage. No mass
lesion. No midline shift, ventriculomegaly or extra-axial fluid
collection. Cerebral volume is within normal limits.

Vascular: No hyperdense vessel or unexpected calcification.
Bilateral carotid siphon atherosclerotic calcifications.

Skull: Negative for fracture or focal lesion.

Sinuses/Orbits: Normal orbits. Clear paranasal sinuses. No mastoid
effusion.

Other: None.

Review of the MIP images confirms the above findings

CTA NECK FINDINGS

Aortic arch: Standard branching. Calcified and noncalcified
atheromatous plaque involving the aortic arch and great vessel
origins with mild narrowing. Proximal left subclavian artery web.
Soft atheromatous plaque is seen within the mid to distal bilateral
subclavian arteries with mild luminal narrowing.

Right carotid system: Proximal CCA soft atheromatous plaque with
approximately 20% luminal narrowing. Bifurcation calcified and
noncalcified atheromatous plaque with narrowing of the proximal ICA.
The lumen is slit like measuring up to 4 mm ([DATE]). This likely
reflects greater than 95% luminal narrowing. Distally patent ICA.

Left carotid system: Proximal CCA soft atheromatous plaque with
approximately 30% luminal narrowing. Calcified and noncalcified
bifurcation atheromatous plaque with narrowing of the proximal ICA.
The lumen measures 3-4 mm in maximal transaxial dimension ([DATE])
with approximately 65% luminal narrowing. Distally patent ICA.

Vertebral arteries: Codominant. Moderate to severe proximal right V1
segment narrowing secondary to noncalcified atheromatous plaque.
Distally patent.

Skeleton: Multilevel spondylosis.  No acute osseous abnormality.

Other neck: Subcentimeter subcutaneous nodules/cis along the left
forehead and right cheek. No adenopathy.

Upper chest: Emphysema.

Review of the MIP images confirms the above findings

CTA HEAD FINDINGS

Anterior circulation: Patent petrous ICA segments. Bilateral carotid
siphon atherosclerotic calcifications with mild-to-moderate
cavernous segment narrowing. Supraclinoid ICAs are patent with mild
narrowing on the left. Patent ACAs and MCAs.

Posterior circulation: Patent PICA. Left V4 segment atheromatous
disease with mild-to-moderate narrowing distally. Patent right V4
segment. Patent basilar and superior cerebellar arteries. Patent
posterior cerebral arteries with fetal origin on the right.

Venous sinuses: As permitted by contrast timing, patent.

Anatomic variants: Please see above.

Review of the MIP images confirms the above findings
IMPRESSION: CT head:

No acute intracranial process.  Remote left parietal insult.

CTA head:

Bilateral carotid siphon atherosclerotic calcifications with
mild-to-moderate bilateral cavernous segment and mild left
supraclinoid ICA narrowing.

Mild to moderate distal left V4 segment narrowing secondary to
atheromatous disease.

CTA neck:

Bilateral carotid bifurcation atheromatous disease with greater than
95% right and approximately 65% left proximal ICA narrowing.

Mild narrowing of the great vessel origins and mid to distal
bilateral subclavian arteries secondary to atheromatous disease.

Proximal left subclavian artery web.

## 2022-08-12 DIAGNOSIS — I10 Essential (primary) hypertension: Secondary | ICD-10-CM | POA: Diagnosis not present

## 2022-08-12 DIAGNOSIS — D72829 Elevated white blood cell count, unspecified: Secondary | ICD-10-CM | POA: Diagnosis not present

## 2022-08-12 DIAGNOSIS — Z9981 Dependence on supplemental oxygen: Secondary | ICD-10-CM | POA: Diagnosis not present

## 2022-08-12 DIAGNOSIS — I429 Cardiomyopathy, unspecified: Secondary | ICD-10-CM | POA: Diagnosis not present

## 2022-08-12 DIAGNOSIS — I255 Ischemic cardiomyopathy: Secondary | ICD-10-CM | POA: Diagnosis not present

## 2022-08-12 DIAGNOSIS — J9602 Acute respiratory failure with hypercapnia: Secondary | ICD-10-CM | POA: Diagnosis not present

## 2022-08-12 DIAGNOSIS — J181 Lobar pneumonia, unspecified organism: Secondary | ICD-10-CM | POA: Diagnosis not present

## 2022-08-12 DIAGNOSIS — L03115 Cellulitis of right lower limb: Secondary | ICD-10-CM | POA: Diagnosis not present

## 2022-08-12 DIAGNOSIS — Z01810 Encounter for preprocedural cardiovascular examination: Secondary | ICD-10-CM | POA: Diagnosis not present

## 2022-08-12 DIAGNOSIS — I952 Hypotension due to drugs: Secondary | ICD-10-CM | POA: Diagnosis not present

## 2022-08-12 DIAGNOSIS — N179 Acute kidney failure, unspecified: Secondary | ICD-10-CM | POA: Diagnosis not present

## 2022-08-12 DIAGNOSIS — Z1159 Encounter for screening for other viral diseases: Secondary | ICD-10-CM | POA: Diagnosis not present

## 2022-08-12 DIAGNOSIS — R042 Hemoptysis: Secondary | ICD-10-CM | POA: Diagnosis not present

## 2022-08-12 DIAGNOSIS — I130 Hypertensive heart and chronic kidney disease with heart failure and stage 1 through stage 4 chronic kidney disease, or unspecified chronic kidney disease: Secondary | ICD-10-CM | POA: Diagnosis not present

## 2022-08-12 DIAGNOSIS — Z20822 Contact with and (suspected) exposure to covid-19: Secondary | ICD-10-CM | POA: Diagnosis not present

## 2022-08-12 DIAGNOSIS — J209 Acute bronchitis, unspecified: Secondary | ICD-10-CM | POA: Diagnosis not present

## 2022-08-12 DIAGNOSIS — R600 Localized edema: Secondary | ICD-10-CM | POA: Diagnosis not present

## 2022-08-12 DIAGNOSIS — J9622 Acute and chronic respiratory failure with hypercapnia: Secondary | ICD-10-CM | POA: Diagnosis not present

## 2022-08-12 DIAGNOSIS — R0789 Other chest pain: Secondary | ICD-10-CM | POA: Diagnosis not present

## 2022-08-12 DIAGNOSIS — R069 Unspecified abnormalities of breathing: Secondary | ICD-10-CM | POA: Diagnosis not present

## 2022-08-12 DIAGNOSIS — E785 Hyperlipidemia, unspecified: Secondary | ICD-10-CM | POA: Diagnosis not present

## 2022-08-12 DIAGNOSIS — R531 Weakness: Secondary | ICD-10-CM | POA: Diagnosis not present

## 2022-08-12 DIAGNOSIS — Z87891 Personal history of nicotine dependence: Secondary | ICD-10-CM | POA: Diagnosis not present

## 2022-08-12 DIAGNOSIS — I509 Heart failure, unspecified: Secondary | ICD-10-CM | POA: Diagnosis not present

## 2022-08-12 DIAGNOSIS — Z955 Presence of coronary angioplasty implant and graft: Secondary | ICD-10-CM | POA: Diagnosis not present

## 2022-08-12 DIAGNOSIS — Z1152 Encounter for screening for COVID-19: Secondary | ICD-10-CM | POA: Diagnosis not present

## 2022-08-12 DIAGNOSIS — J440 Chronic obstructive pulmonary disease with acute lower respiratory infection: Secondary | ICD-10-CM | POA: Diagnosis not present

## 2022-08-12 DIAGNOSIS — E860 Dehydration: Secondary | ICD-10-CM | POA: Diagnosis not present

## 2022-08-12 DIAGNOSIS — T447X5A Adverse effect of beta-adrenoreceptor antagonists, initial encounter: Secondary | ICD-10-CM | POA: Diagnosis not present

## 2022-08-12 DIAGNOSIS — I2510 Atherosclerotic heart disease of native coronary artery without angina pectoris: Secondary | ICD-10-CM | POA: Diagnosis not present

## 2022-08-12 DIAGNOSIS — I428 Other cardiomyopathies: Secondary | ICD-10-CM | POA: Diagnosis not present

## 2022-08-12 DIAGNOSIS — R0902 Hypoxemia: Secondary | ICD-10-CM | POA: Diagnosis not present

## 2022-08-12 DIAGNOSIS — R52 Pain, unspecified: Secondary | ICD-10-CM | POA: Diagnosis not present

## 2022-08-12 DIAGNOSIS — J069 Acute upper respiratory infection, unspecified: Secondary | ICD-10-CM | POA: Diagnosis not present

## 2022-08-12 DIAGNOSIS — R079 Chest pain, unspecified: Secondary | ICD-10-CM | POA: Diagnosis not present

## 2022-08-12 DIAGNOSIS — J9621 Acute and chronic respiratory failure with hypoxia: Secondary | ICD-10-CM | POA: Diagnosis not present

## 2022-08-12 DIAGNOSIS — N170 Acute kidney failure with tubular necrosis: Secondary | ICD-10-CM | POA: Diagnosis not present

## 2022-08-12 DIAGNOSIS — N189 Chronic kidney disease, unspecified: Secondary | ICD-10-CM | POA: Diagnosis not present

## 2022-08-12 DIAGNOSIS — R55 Syncope and collapse: Secondary | ICD-10-CM | POA: Diagnosis not present

## 2022-08-12 DIAGNOSIS — I5021 Acute systolic (congestive) heart failure: Secondary | ICD-10-CM | POA: Diagnosis not present

## 2022-08-12 DIAGNOSIS — J90 Pleural effusion, not elsewhere classified: Secondary | ICD-10-CM | POA: Diagnosis not present

## 2022-08-12 DIAGNOSIS — M6281 Muscle weakness (generalized): Secondary | ICD-10-CM | POA: Diagnosis not present

## 2022-08-12 DIAGNOSIS — J449 Chronic obstructive pulmonary disease, unspecified: Secondary | ICD-10-CM | POA: Diagnosis not present

## 2022-08-12 DIAGNOSIS — R062 Wheezing: Secondary | ICD-10-CM | POA: Diagnosis not present

## 2022-08-12 DIAGNOSIS — J441 Chronic obstructive pulmonary disease with (acute) exacerbation: Secondary | ICD-10-CM | POA: Diagnosis present

## 2022-08-12 DIAGNOSIS — J189 Pneumonia, unspecified organism: Secondary | ICD-10-CM | POA: Diagnosis not present

## 2022-08-23 DIAGNOSIS — J9621 Acute and chronic respiratory failure with hypoxia: Secondary | ICD-10-CM | POA: Diagnosis not present

## 2022-08-23 DIAGNOSIS — Z1159 Encounter for screening for other viral diseases: Secondary | ICD-10-CM | POA: Diagnosis not present

## 2022-08-23 DIAGNOSIS — Z7189 Other specified counseling: Secondary | ICD-10-CM | POA: Diagnosis not present

## 2022-08-23 DIAGNOSIS — J90 Pleural effusion, not elsewhere classified: Secondary | ICD-10-CM | POA: Diagnosis not present

## 2022-08-23 DIAGNOSIS — E785 Hyperlipidemia, unspecified: Secondary | ICD-10-CM | POA: Diagnosis not present

## 2022-08-23 DIAGNOSIS — J449 Chronic obstructive pulmonary disease, unspecified: Secondary | ICD-10-CM | POA: Diagnosis not present

## 2022-08-23 DIAGNOSIS — R531 Weakness: Secondary | ICD-10-CM | POA: Diagnosis not present

## 2022-08-23 DIAGNOSIS — J9602 Acute respiratory failure with hypercapnia: Secondary | ICD-10-CM | POA: Diagnosis not present

## 2022-08-23 DIAGNOSIS — E559 Vitamin D deficiency, unspecified: Secondary | ICD-10-CM | POA: Diagnosis not present

## 2022-08-23 DIAGNOSIS — R55 Syncope and collapse: Secondary | ICD-10-CM | POA: Diagnosis not present

## 2022-08-23 DIAGNOSIS — M6281 Muscle weakness (generalized): Secondary | ICD-10-CM | POA: Diagnosis not present

## 2022-08-23 DIAGNOSIS — R042 Hemoptysis: Secondary | ICD-10-CM | POA: Diagnosis not present

## 2022-08-23 DIAGNOSIS — J441 Chronic obstructive pulmonary disease with (acute) exacerbation: Secondary | ICD-10-CM | POA: Diagnosis not present

## 2022-08-23 DIAGNOSIS — I5022 Chronic systolic (congestive) heart failure: Secondary | ICD-10-CM | POA: Diagnosis not present

## 2022-08-23 DIAGNOSIS — I509 Heart failure, unspecified: Secondary | ICD-10-CM | POA: Diagnosis not present

## 2022-08-23 DIAGNOSIS — I10 Essential (primary) hypertension: Secondary | ICD-10-CM | POA: Diagnosis not present

## 2022-08-23 DIAGNOSIS — L03115 Cellulitis of right lower limb: Secondary | ICD-10-CM | POA: Diagnosis not present

## 2022-08-23 DIAGNOSIS — I959 Hypotension, unspecified: Secondary | ICD-10-CM | POA: Diagnosis not present

## 2022-08-23 DIAGNOSIS — Z9981 Dependence on supplemental oxygen: Secondary | ICD-10-CM | POA: Diagnosis not present

## 2022-08-23 DIAGNOSIS — I5021 Acute systolic (congestive) heart failure: Secondary | ICD-10-CM | POA: Diagnosis not present

## 2022-08-23 DIAGNOSIS — R079 Chest pain, unspecified: Secondary | ICD-10-CM | POA: Diagnosis not present

## 2022-08-23 DIAGNOSIS — I2510 Atherosclerotic heart disease of native coronary artery without angina pectoris: Secondary | ICD-10-CM | POA: Diagnosis not present

## 2022-08-23 DIAGNOSIS — J9620 Acute and chronic respiratory failure, unspecified whether with hypoxia or hypercapnia: Secondary | ICD-10-CM | POA: Diagnosis not present

## 2022-08-23 DIAGNOSIS — Z955 Presence of coronary angioplasty implant and graft: Secondary | ICD-10-CM | POA: Diagnosis not present

## 2022-08-23 DIAGNOSIS — R0789 Other chest pain: Secondary | ICD-10-CM | POA: Diagnosis not present

## 2022-08-23 DIAGNOSIS — I5020 Unspecified systolic (congestive) heart failure: Secondary | ICD-10-CM | POA: Diagnosis not present

## 2022-08-23 DIAGNOSIS — D72829 Elevated white blood cell count, unspecified: Secondary | ICD-10-CM | POA: Diagnosis not present

## 2022-08-23 DIAGNOSIS — J189 Pneumonia, unspecified organism: Secondary | ICD-10-CM | POA: Diagnosis not present

## 2022-08-24 DIAGNOSIS — J441 Chronic obstructive pulmonary disease with (acute) exacerbation: Secondary | ICD-10-CM | POA: Diagnosis not present

## 2022-08-24 DIAGNOSIS — J189 Pneumonia, unspecified organism: Secondary | ICD-10-CM | POA: Diagnosis not present

## 2022-08-24 DIAGNOSIS — I10 Essential (primary) hypertension: Secondary | ICD-10-CM | POA: Diagnosis not present

## 2022-08-24 DIAGNOSIS — J90 Pleural effusion, not elsewhere classified: Secondary | ICD-10-CM | POA: Diagnosis not present

## 2022-08-24 DIAGNOSIS — R042 Hemoptysis: Secondary | ICD-10-CM | POA: Diagnosis not present

## 2022-08-24 DIAGNOSIS — I5020 Unspecified systolic (congestive) heart failure: Secondary | ICD-10-CM | POA: Diagnosis not present

## 2022-08-24 DIAGNOSIS — L03115 Cellulitis of right lower limb: Secondary | ICD-10-CM | POA: Diagnosis not present

## 2022-08-24 DIAGNOSIS — J9620 Acute and chronic respiratory failure, unspecified whether with hypoxia or hypercapnia: Secondary | ICD-10-CM | POA: Diagnosis not present

## 2022-08-24 DIAGNOSIS — Z7189 Other specified counseling: Secondary | ICD-10-CM | POA: Diagnosis not present

## 2022-08-24 DIAGNOSIS — I2510 Atherosclerotic heart disease of native coronary artery without angina pectoris: Secondary | ICD-10-CM | POA: Diagnosis not present

## 2022-08-26 DIAGNOSIS — I5022 Chronic systolic (congestive) heart failure: Secondary | ICD-10-CM | POA: Diagnosis not present

## 2022-08-26 DIAGNOSIS — D72829 Elevated white blood cell count, unspecified: Secondary | ICD-10-CM | POA: Diagnosis not present

## 2022-08-26 DIAGNOSIS — E559 Vitamin D deficiency, unspecified: Secondary | ICD-10-CM | POA: Diagnosis not present

## 2022-08-28 DIAGNOSIS — J189 Pneumonia, unspecified organism: Secondary | ICD-10-CM | POA: Diagnosis not present

## 2022-09-02 DIAGNOSIS — I5022 Chronic systolic (congestive) heart failure: Secondary | ICD-10-CM | POA: Diagnosis not present

## 2022-09-02 DIAGNOSIS — D72829 Elevated white blood cell count, unspecified: Secondary | ICD-10-CM | POA: Diagnosis not present

## 2022-09-04 DIAGNOSIS — J189 Pneumonia, unspecified organism: Secondary | ICD-10-CM | POA: Diagnosis not present

## 2022-09-04 DIAGNOSIS — I2510 Atherosclerotic heart disease of native coronary artery without angina pectoris: Secondary | ICD-10-CM | POA: Diagnosis not present

## 2022-09-04 DIAGNOSIS — J441 Chronic obstructive pulmonary disease with (acute) exacerbation: Secondary | ICD-10-CM | POA: Diagnosis not present

## 2022-09-04 DIAGNOSIS — I5022 Chronic systolic (congestive) heart failure: Secondary | ICD-10-CM | POA: Diagnosis not present

## 2022-09-11 DIAGNOSIS — J449 Chronic obstructive pulmonary disease, unspecified: Secondary | ICD-10-CM | POA: Diagnosis not present

## 2022-09-13 DIAGNOSIS — I959 Hypotension, unspecified: Secondary | ICD-10-CM | POA: Diagnosis not present

## 2022-09-18 DIAGNOSIS — I10 Essential (primary) hypertension: Secondary | ICD-10-CM | POA: Diagnosis not present

## 2022-09-18 DIAGNOSIS — E559 Vitamin D deficiency, unspecified: Secondary | ICD-10-CM | POA: Diagnosis not present

## 2022-09-18 DIAGNOSIS — J449 Chronic obstructive pulmonary disease, unspecified: Secondary | ICD-10-CM | POA: Diagnosis not present

## 2022-09-18 DIAGNOSIS — I5022 Chronic systolic (congestive) heart failure: Secondary | ICD-10-CM | POA: Diagnosis not present

## 2022-09-18 DIAGNOSIS — I2510 Atherosclerotic heart disease of native coronary artery without angina pectoris: Secondary | ICD-10-CM | POA: Diagnosis not present

## 2022-09-18 DIAGNOSIS — J9620 Acute and chronic respiratory failure, unspecified whether with hypoxia or hypercapnia: Secondary | ICD-10-CM | POA: Diagnosis not present

## 2022-09-20 DIAGNOSIS — R042 Hemoptysis: Secondary | ICD-10-CM | POA: Diagnosis not present

## 2022-09-20 DIAGNOSIS — I2510 Atherosclerotic heart disease of native coronary artery without angina pectoris: Secondary | ICD-10-CM | POA: Diagnosis not present

## 2022-09-20 DIAGNOSIS — J189 Pneumonia, unspecified organism: Secondary | ICD-10-CM | POA: Diagnosis not present

## 2022-09-20 DIAGNOSIS — E785 Hyperlipidemia, unspecified: Secondary | ICD-10-CM | POA: Diagnosis not present

## 2022-09-20 DIAGNOSIS — J441 Chronic obstructive pulmonary disease with (acute) exacerbation: Secondary | ICD-10-CM | POA: Diagnosis not present

## 2022-09-20 DIAGNOSIS — L03115 Cellulitis of right lower limb: Secondary | ICD-10-CM | POA: Diagnosis not present

## 2022-09-20 DIAGNOSIS — J9620 Acute and chronic respiratory failure, unspecified whether with hypoxia or hypercapnia: Secondary | ICD-10-CM | POA: Diagnosis not present

## 2022-09-20 DIAGNOSIS — I5020 Unspecified systolic (congestive) heart failure: Secondary | ICD-10-CM | POA: Diagnosis not present

## 2022-09-20 DIAGNOSIS — I509 Heart failure, unspecified: Secondary | ICD-10-CM | POA: Diagnosis not present

## 2022-09-20 DIAGNOSIS — I10 Essential (primary) hypertension: Secondary | ICD-10-CM | POA: Diagnosis not present

## 2022-09-21 DIAGNOSIS — J9611 Chronic respiratory failure with hypoxia: Secondary | ICD-10-CM | POA: Diagnosis not present

## 2022-09-21 DIAGNOSIS — I2510 Atherosclerotic heart disease of native coronary artery without angina pectoris: Secondary | ICD-10-CM | POA: Diagnosis not present

## 2022-09-21 DIAGNOSIS — I110 Hypertensive heart disease with heart failure: Secondary | ICD-10-CM | POA: Diagnosis not present

## 2022-09-21 DIAGNOSIS — Z955 Presence of coronary angioplasty implant and graft: Secondary | ICD-10-CM | POA: Diagnosis not present

## 2022-09-21 DIAGNOSIS — Z7902 Long term (current) use of antithrombotics/antiplatelets: Secondary | ICD-10-CM | POA: Diagnosis not present

## 2022-09-21 DIAGNOSIS — E559 Vitamin D deficiency, unspecified: Secondary | ICD-10-CM | POA: Diagnosis not present

## 2022-09-21 DIAGNOSIS — J189 Pneumonia, unspecified organism: Secondary | ICD-10-CM | POA: Diagnosis not present

## 2022-09-21 DIAGNOSIS — Z7982 Long term (current) use of aspirin: Secondary | ICD-10-CM | POA: Diagnosis not present

## 2022-09-21 DIAGNOSIS — Z87891 Personal history of nicotine dependence: Secondary | ICD-10-CM | POA: Diagnosis not present

## 2022-09-21 DIAGNOSIS — I5022 Chronic systolic (congestive) heart failure: Secondary | ICD-10-CM | POA: Diagnosis not present

## 2022-09-21 DIAGNOSIS — J9612 Chronic respiratory failure with hypercapnia: Secondary | ICD-10-CM | POA: Diagnosis not present

## 2022-09-21 DIAGNOSIS — J440 Chronic obstructive pulmonary disease with acute lower respiratory infection: Secondary | ICD-10-CM | POA: Diagnosis not present

## 2022-09-21 DIAGNOSIS — I951 Orthostatic hypotension: Secondary | ICD-10-CM | POA: Diagnosis not present

## 2022-09-21 DIAGNOSIS — Z9981 Dependence on supplemental oxygen: Secondary | ICD-10-CM | POA: Diagnosis not present

## 2022-09-25 DIAGNOSIS — I5022 Chronic systolic (congestive) heart failure: Secondary | ICD-10-CM | POA: Diagnosis not present

## 2022-09-25 DIAGNOSIS — J440 Chronic obstructive pulmonary disease with acute lower respiratory infection: Secondary | ICD-10-CM | POA: Diagnosis not present

## 2022-09-25 DIAGNOSIS — J9612 Chronic respiratory failure with hypercapnia: Secondary | ICD-10-CM | POA: Diagnosis not present

## 2022-09-25 DIAGNOSIS — I110 Hypertensive heart disease with heart failure: Secondary | ICD-10-CM | POA: Diagnosis not present

## 2022-09-25 DIAGNOSIS — J9611 Chronic respiratory failure with hypoxia: Secondary | ICD-10-CM | POA: Diagnosis not present

## 2022-09-25 DIAGNOSIS — J189 Pneumonia, unspecified organism: Secondary | ICD-10-CM | POA: Diagnosis not present

## 2022-09-26 DIAGNOSIS — I959 Hypotension, unspecified: Secondary | ICD-10-CM | POA: Diagnosis not present

## 2022-09-26 DIAGNOSIS — R059 Cough, unspecified: Secondary | ICD-10-CM | POA: Diagnosis not present

## 2022-09-26 DIAGNOSIS — R42 Dizziness and giddiness: Secondary | ICD-10-CM | POA: Diagnosis not present

## 2022-09-26 DIAGNOSIS — R918 Other nonspecific abnormal finding of lung field: Secondary | ICD-10-CM | POA: Diagnosis not present

## 2022-09-28 DIAGNOSIS — I110 Hypertensive heart disease with heart failure: Secondary | ICD-10-CM | POA: Diagnosis not present

## 2022-09-28 DIAGNOSIS — J440 Chronic obstructive pulmonary disease with acute lower respiratory infection: Secondary | ICD-10-CM | POA: Diagnosis not present

## 2022-09-28 DIAGNOSIS — J9612 Chronic respiratory failure with hypercapnia: Secondary | ICD-10-CM | POA: Diagnosis not present

## 2022-09-28 DIAGNOSIS — J189 Pneumonia, unspecified organism: Secondary | ICD-10-CM | POA: Diagnosis not present

## 2022-09-28 DIAGNOSIS — J9611 Chronic respiratory failure with hypoxia: Secondary | ICD-10-CM | POA: Diagnosis not present

## 2022-09-28 DIAGNOSIS — I5022 Chronic systolic (congestive) heart failure: Secondary | ICD-10-CM | POA: Diagnosis not present

## 2022-10-02 DIAGNOSIS — J189 Pneumonia, unspecified organism: Secondary | ICD-10-CM | POA: Diagnosis not present

## 2022-10-02 DIAGNOSIS — I5022 Chronic systolic (congestive) heart failure: Secondary | ICD-10-CM | POA: Diagnosis not present

## 2022-10-02 DIAGNOSIS — I110 Hypertensive heart disease with heart failure: Secondary | ICD-10-CM | POA: Diagnosis not present

## 2022-10-02 DIAGNOSIS — J9612 Chronic respiratory failure with hypercapnia: Secondary | ICD-10-CM | POA: Diagnosis not present

## 2022-10-02 DIAGNOSIS — J9611 Chronic respiratory failure with hypoxia: Secondary | ICD-10-CM | POA: Diagnosis not present

## 2022-10-02 DIAGNOSIS — J440 Chronic obstructive pulmonary disease with acute lower respiratory infection: Secondary | ICD-10-CM | POA: Diagnosis not present

## 2022-10-03 DIAGNOSIS — Z87891 Personal history of nicotine dependence: Secondary | ICD-10-CM | POA: Diagnosis not present

## 2022-10-03 DIAGNOSIS — J449 Chronic obstructive pulmonary disease, unspecified: Secondary | ICD-10-CM | POA: Diagnosis not present

## 2022-10-05 DIAGNOSIS — J440 Chronic obstructive pulmonary disease with acute lower respiratory infection: Secondary | ICD-10-CM | POA: Diagnosis not present

## 2022-10-05 DIAGNOSIS — I110 Hypertensive heart disease with heart failure: Secondary | ICD-10-CM | POA: Diagnosis not present

## 2022-10-05 DIAGNOSIS — I5022 Chronic systolic (congestive) heart failure: Secondary | ICD-10-CM | POA: Diagnosis not present

## 2022-10-05 DIAGNOSIS — J9612 Chronic respiratory failure with hypercapnia: Secondary | ICD-10-CM | POA: Diagnosis not present

## 2022-10-05 DIAGNOSIS — J9611 Chronic respiratory failure with hypoxia: Secondary | ICD-10-CM | POA: Diagnosis not present

## 2022-10-05 DIAGNOSIS — J189 Pneumonia, unspecified organism: Secondary | ICD-10-CM | POA: Diagnosis not present

## 2022-10-09 DIAGNOSIS — J440 Chronic obstructive pulmonary disease with acute lower respiratory infection: Secondary | ICD-10-CM | POA: Diagnosis not present

## 2022-10-09 DIAGNOSIS — I5022 Chronic systolic (congestive) heart failure: Secondary | ICD-10-CM | POA: Diagnosis not present

## 2022-10-09 DIAGNOSIS — I110 Hypertensive heart disease with heart failure: Secondary | ICD-10-CM | POA: Diagnosis not present

## 2022-10-09 DIAGNOSIS — J9611 Chronic respiratory failure with hypoxia: Secondary | ICD-10-CM | POA: Diagnosis not present

## 2022-10-09 DIAGNOSIS — J189 Pneumonia, unspecified organism: Secondary | ICD-10-CM | POA: Diagnosis not present

## 2022-10-09 DIAGNOSIS — J9612 Chronic respiratory failure with hypercapnia: Secondary | ICD-10-CM | POA: Diagnosis not present

## 2022-10-11 DIAGNOSIS — I5022 Chronic systolic (congestive) heart failure: Secondary | ICD-10-CM | POA: Diagnosis not present

## 2022-10-11 DIAGNOSIS — J440 Chronic obstructive pulmonary disease with acute lower respiratory infection: Secondary | ICD-10-CM | POA: Diagnosis not present

## 2022-10-11 DIAGNOSIS — J9611 Chronic respiratory failure with hypoxia: Secondary | ICD-10-CM | POA: Diagnosis not present

## 2022-10-11 DIAGNOSIS — J189 Pneumonia, unspecified organism: Secondary | ICD-10-CM | POA: Diagnosis not present

## 2022-10-11 DIAGNOSIS — I110 Hypertensive heart disease with heart failure: Secondary | ICD-10-CM | POA: Diagnosis not present

## 2022-10-11 DIAGNOSIS — J9612 Chronic respiratory failure with hypercapnia: Secondary | ICD-10-CM | POA: Diagnosis not present

## 2022-10-12 DIAGNOSIS — I5022 Chronic systolic (congestive) heart failure: Secondary | ICD-10-CM | POA: Diagnosis not present

## 2022-10-12 DIAGNOSIS — I110 Hypertensive heart disease with heart failure: Secondary | ICD-10-CM | POA: Diagnosis not present

## 2022-10-12 DIAGNOSIS — J189 Pneumonia, unspecified organism: Secondary | ICD-10-CM | POA: Diagnosis not present

## 2022-10-12 DIAGNOSIS — J9611 Chronic respiratory failure with hypoxia: Secondary | ICD-10-CM | POA: Diagnosis not present

## 2022-10-12 DIAGNOSIS — J440 Chronic obstructive pulmonary disease with acute lower respiratory infection: Secondary | ICD-10-CM | POA: Diagnosis not present

## 2022-10-12 DIAGNOSIS — J9612 Chronic respiratory failure with hypercapnia: Secondary | ICD-10-CM | POA: Diagnosis not present

## 2022-10-17 DIAGNOSIS — I110 Hypertensive heart disease with heart failure: Secondary | ICD-10-CM | POA: Diagnosis not present

## 2022-10-17 DIAGNOSIS — J9611 Chronic respiratory failure with hypoxia: Secondary | ICD-10-CM | POA: Diagnosis not present

## 2022-10-17 DIAGNOSIS — I5022 Chronic systolic (congestive) heart failure: Secondary | ICD-10-CM | POA: Diagnosis not present

## 2022-10-17 DIAGNOSIS — J440 Chronic obstructive pulmonary disease with acute lower respiratory infection: Secondary | ICD-10-CM | POA: Diagnosis not present

## 2022-10-17 DIAGNOSIS — J189 Pneumonia, unspecified organism: Secondary | ICD-10-CM | POA: Diagnosis not present

## 2022-10-17 DIAGNOSIS — J9612 Chronic respiratory failure with hypercapnia: Secondary | ICD-10-CM | POA: Diagnosis not present

## 2022-10-18 DIAGNOSIS — I5022 Chronic systolic (congestive) heart failure: Secondary | ICD-10-CM | POA: Diagnosis not present

## 2022-10-18 DIAGNOSIS — J189 Pneumonia, unspecified organism: Secondary | ICD-10-CM | POA: Diagnosis not present

## 2022-10-18 DIAGNOSIS — J440 Chronic obstructive pulmonary disease with acute lower respiratory infection: Secondary | ICD-10-CM | POA: Diagnosis not present

## 2022-10-18 DIAGNOSIS — J9612 Chronic respiratory failure with hypercapnia: Secondary | ICD-10-CM | POA: Diagnosis not present

## 2022-10-18 DIAGNOSIS — J9611 Chronic respiratory failure with hypoxia: Secondary | ICD-10-CM | POA: Diagnosis not present

## 2022-10-18 DIAGNOSIS — I110 Hypertensive heart disease with heart failure: Secondary | ICD-10-CM | POA: Diagnosis not present

## 2022-10-21 DIAGNOSIS — J9611 Chronic respiratory failure with hypoxia: Secondary | ICD-10-CM | POA: Diagnosis not present

## 2022-10-21 DIAGNOSIS — I110 Hypertensive heart disease with heart failure: Secondary | ICD-10-CM | POA: Diagnosis not present

## 2022-10-21 DIAGNOSIS — J9612 Chronic respiratory failure with hypercapnia: Secondary | ICD-10-CM | POA: Diagnosis not present

## 2022-10-21 DIAGNOSIS — J440 Chronic obstructive pulmonary disease with acute lower respiratory infection: Secondary | ICD-10-CM | POA: Diagnosis not present

## 2022-10-21 DIAGNOSIS — I2510 Atherosclerotic heart disease of native coronary artery without angina pectoris: Secondary | ICD-10-CM | POA: Diagnosis not present

## 2022-10-21 DIAGNOSIS — Z7902 Long term (current) use of antithrombotics/antiplatelets: Secondary | ICD-10-CM | POA: Diagnosis not present

## 2022-10-21 DIAGNOSIS — Z955 Presence of coronary angioplasty implant and graft: Secondary | ICD-10-CM | POA: Diagnosis not present

## 2022-10-21 DIAGNOSIS — Z9981 Dependence on supplemental oxygen: Secondary | ICD-10-CM | POA: Diagnosis not present

## 2022-10-21 DIAGNOSIS — I951 Orthostatic hypotension: Secondary | ICD-10-CM | POA: Diagnosis not present

## 2022-10-21 DIAGNOSIS — Z7982 Long term (current) use of aspirin: Secondary | ICD-10-CM | POA: Diagnosis not present

## 2022-10-21 DIAGNOSIS — Z87891 Personal history of nicotine dependence: Secondary | ICD-10-CM | POA: Diagnosis not present

## 2022-10-21 DIAGNOSIS — E559 Vitamin D deficiency, unspecified: Secondary | ICD-10-CM | POA: Diagnosis not present

## 2022-10-21 DIAGNOSIS — J189 Pneumonia, unspecified organism: Secondary | ICD-10-CM | POA: Diagnosis not present

## 2022-10-21 DIAGNOSIS — I5022 Chronic systolic (congestive) heart failure: Secondary | ICD-10-CM | POA: Diagnosis not present

## 2022-10-23 DIAGNOSIS — J189 Pneumonia, unspecified organism: Secondary | ICD-10-CM | POA: Diagnosis not present

## 2022-10-23 DIAGNOSIS — J9612 Chronic respiratory failure with hypercapnia: Secondary | ICD-10-CM | POA: Diagnosis not present

## 2022-10-23 DIAGNOSIS — J440 Chronic obstructive pulmonary disease with acute lower respiratory infection: Secondary | ICD-10-CM | POA: Diagnosis not present

## 2022-10-23 DIAGNOSIS — I5022 Chronic systolic (congestive) heart failure: Secondary | ICD-10-CM | POA: Diagnosis not present

## 2022-10-23 DIAGNOSIS — I110 Hypertensive heart disease with heart failure: Secondary | ICD-10-CM | POA: Diagnosis not present

## 2022-10-23 DIAGNOSIS — J9611 Chronic respiratory failure with hypoxia: Secondary | ICD-10-CM | POA: Diagnosis not present

## 2022-10-24 DIAGNOSIS — I5022 Chronic systolic (congestive) heart failure: Secondary | ICD-10-CM | POA: Diagnosis not present

## 2022-10-24 DIAGNOSIS — J440 Chronic obstructive pulmonary disease with acute lower respiratory infection: Secondary | ICD-10-CM | POA: Diagnosis not present

## 2022-10-24 DIAGNOSIS — I110 Hypertensive heart disease with heart failure: Secondary | ICD-10-CM | POA: Diagnosis not present

## 2022-10-24 DIAGNOSIS — J189 Pneumonia, unspecified organism: Secondary | ICD-10-CM | POA: Diagnosis not present

## 2022-10-24 DIAGNOSIS — J9611 Chronic respiratory failure with hypoxia: Secondary | ICD-10-CM | POA: Diagnosis not present

## 2022-10-24 DIAGNOSIS — J9612 Chronic respiratory failure with hypercapnia: Secondary | ICD-10-CM | POA: Diagnosis not present

## 2022-10-26 DIAGNOSIS — I5022 Chronic systolic (congestive) heart failure: Secondary | ICD-10-CM | POA: Diagnosis not present

## 2022-10-26 DIAGNOSIS — J9612 Chronic respiratory failure with hypercapnia: Secondary | ICD-10-CM | POA: Diagnosis not present

## 2022-10-26 DIAGNOSIS — J189 Pneumonia, unspecified organism: Secondary | ICD-10-CM | POA: Diagnosis not present

## 2022-10-26 DIAGNOSIS — J440 Chronic obstructive pulmonary disease with acute lower respiratory infection: Secondary | ICD-10-CM | POA: Diagnosis not present

## 2022-10-26 DIAGNOSIS — J9611 Chronic respiratory failure with hypoxia: Secondary | ICD-10-CM | POA: Diagnosis not present

## 2022-10-26 DIAGNOSIS — I110 Hypertensive heart disease with heart failure: Secondary | ICD-10-CM | POA: Diagnosis not present

## 2022-10-27 DIAGNOSIS — J189 Pneumonia, unspecified organism: Secondary | ICD-10-CM | POA: Diagnosis not present

## 2022-10-27 DIAGNOSIS — I5022 Chronic systolic (congestive) heart failure: Secondary | ICD-10-CM | POA: Diagnosis not present

## 2022-10-27 DIAGNOSIS — J440 Chronic obstructive pulmonary disease with acute lower respiratory infection: Secondary | ICD-10-CM | POA: Diagnosis not present

## 2022-10-27 DIAGNOSIS — J9612 Chronic respiratory failure with hypercapnia: Secondary | ICD-10-CM | POA: Diagnosis not present

## 2022-10-27 DIAGNOSIS — J9611 Chronic respiratory failure with hypoxia: Secondary | ICD-10-CM | POA: Diagnosis not present

## 2022-10-27 DIAGNOSIS — I110 Hypertensive heart disease with heart failure: Secondary | ICD-10-CM | POA: Diagnosis not present

## 2022-10-30 DIAGNOSIS — J449 Chronic obstructive pulmonary disease, unspecified: Secondary | ICD-10-CM | POA: Diagnosis not present

## 2022-10-31 DIAGNOSIS — J9612 Chronic respiratory failure with hypercapnia: Secondary | ICD-10-CM | POA: Diagnosis not present

## 2022-10-31 DIAGNOSIS — I5022 Chronic systolic (congestive) heart failure: Secondary | ICD-10-CM | POA: Diagnosis not present

## 2022-10-31 DIAGNOSIS — J440 Chronic obstructive pulmonary disease with acute lower respiratory infection: Secondary | ICD-10-CM | POA: Diagnosis not present

## 2022-10-31 DIAGNOSIS — I110 Hypertensive heart disease with heart failure: Secondary | ICD-10-CM | POA: Diagnosis not present

## 2022-10-31 DIAGNOSIS — J9611 Chronic respiratory failure with hypoxia: Secondary | ICD-10-CM | POA: Diagnosis not present

## 2022-10-31 DIAGNOSIS — J189 Pneumonia, unspecified organism: Secondary | ICD-10-CM | POA: Diagnosis not present

## 2022-11-03 DIAGNOSIS — Z299 Encounter for prophylactic measures, unspecified: Secondary | ICD-10-CM | POA: Diagnosis not present

## 2022-11-03 DIAGNOSIS — I10 Essential (primary) hypertension: Secondary | ICD-10-CM | POA: Diagnosis not present

## 2022-11-03 DIAGNOSIS — I25119 Atherosclerotic heart disease of native coronary artery with unspecified angina pectoris: Secondary | ICD-10-CM | POA: Diagnosis not present

## 2022-11-03 DIAGNOSIS — D692 Other nonthrombocytopenic purpura: Secondary | ICD-10-CM | POA: Diagnosis not present

## 2022-11-03 DIAGNOSIS — J9611 Chronic respiratory failure with hypoxia: Secondary | ICD-10-CM | POA: Diagnosis not present

## 2022-11-03 DIAGNOSIS — I5022 Chronic systolic (congestive) heart failure: Secondary | ICD-10-CM | POA: Diagnosis not present

## 2022-11-06 DIAGNOSIS — J189 Pneumonia, unspecified organism: Secondary | ICD-10-CM | POA: Diagnosis not present

## 2022-11-06 DIAGNOSIS — I110 Hypertensive heart disease with heart failure: Secondary | ICD-10-CM | POA: Diagnosis not present

## 2022-11-06 DIAGNOSIS — J9612 Chronic respiratory failure with hypercapnia: Secondary | ICD-10-CM | POA: Diagnosis not present

## 2022-11-06 DIAGNOSIS — I5022 Chronic systolic (congestive) heart failure: Secondary | ICD-10-CM | POA: Diagnosis not present

## 2022-11-06 DIAGNOSIS — J9611 Chronic respiratory failure with hypoxia: Secondary | ICD-10-CM | POA: Diagnosis not present

## 2022-11-06 DIAGNOSIS — J440 Chronic obstructive pulmonary disease with acute lower respiratory infection: Secondary | ICD-10-CM | POA: Diagnosis not present

## 2022-11-09 DIAGNOSIS — J189 Pneumonia, unspecified organism: Secondary | ICD-10-CM | POA: Diagnosis not present

## 2022-11-09 DIAGNOSIS — J9611 Chronic respiratory failure with hypoxia: Secondary | ICD-10-CM | POA: Diagnosis not present

## 2022-11-09 DIAGNOSIS — J440 Chronic obstructive pulmonary disease with acute lower respiratory infection: Secondary | ICD-10-CM | POA: Diagnosis not present

## 2022-11-09 DIAGNOSIS — I110 Hypertensive heart disease with heart failure: Secondary | ICD-10-CM | POA: Diagnosis not present

## 2022-11-09 DIAGNOSIS — J9612 Chronic respiratory failure with hypercapnia: Secondary | ICD-10-CM | POA: Diagnosis not present

## 2022-11-09 DIAGNOSIS — I5022 Chronic systolic (congestive) heart failure: Secondary | ICD-10-CM | POA: Diagnosis not present

## 2022-11-13 DIAGNOSIS — J189 Pneumonia, unspecified organism: Secondary | ICD-10-CM | POA: Diagnosis not present

## 2022-11-13 DIAGNOSIS — I5022 Chronic systolic (congestive) heart failure: Secondary | ICD-10-CM | POA: Diagnosis not present

## 2022-11-13 DIAGNOSIS — J9611 Chronic respiratory failure with hypoxia: Secondary | ICD-10-CM | POA: Diagnosis not present

## 2022-11-13 DIAGNOSIS — I110 Hypertensive heart disease with heart failure: Secondary | ICD-10-CM | POA: Diagnosis not present

## 2022-11-13 DIAGNOSIS — J440 Chronic obstructive pulmonary disease with acute lower respiratory infection: Secondary | ICD-10-CM | POA: Diagnosis not present

## 2022-11-13 DIAGNOSIS — J9612 Chronic respiratory failure with hypercapnia: Secondary | ICD-10-CM | POA: Diagnosis not present

## 2022-11-14 DIAGNOSIS — J9612 Chronic respiratory failure with hypercapnia: Secondary | ICD-10-CM | POA: Diagnosis not present

## 2022-11-14 DIAGNOSIS — J189 Pneumonia, unspecified organism: Secondary | ICD-10-CM | POA: Diagnosis not present

## 2022-11-14 DIAGNOSIS — I5022 Chronic systolic (congestive) heart failure: Secondary | ICD-10-CM | POA: Diagnosis not present

## 2022-11-14 DIAGNOSIS — J9611 Chronic respiratory failure with hypoxia: Secondary | ICD-10-CM | POA: Diagnosis not present

## 2022-11-14 DIAGNOSIS — I110 Hypertensive heart disease with heart failure: Secondary | ICD-10-CM | POA: Diagnosis not present

## 2022-11-14 DIAGNOSIS — J440 Chronic obstructive pulmonary disease with acute lower respiratory infection: Secondary | ICD-10-CM | POA: Diagnosis not present

## 2022-11-17 DIAGNOSIS — J189 Pneumonia, unspecified organism: Secondary | ICD-10-CM | POA: Diagnosis not present

## 2022-11-17 DIAGNOSIS — J9612 Chronic respiratory failure with hypercapnia: Secondary | ICD-10-CM | POA: Diagnosis not present

## 2022-11-17 DIAGNOSIS — I110 Hypertensive heart disease with heart failure: Secondary | ICD-10-CM | POA: Diagnosis not present

## 2022-11-17 DIAGNOSIS — J9611 Chronic respiratory failure with hypoxia: Secondary | ICD-10-CM | POA: Diagnosis not present

## 2022-11-17 DIAGNOSIS — I5022 Chronic systolic (congestive) heart failure: Secondary | ICD-10-CM | POA: Diagnosis not present

## 2022-11-17 DIAGNOSIS — J440 Chronic obstructive pulmonary disease with acute lower respiratory infection: Secondary | ICD-10-CM | POA: Diagnosis not present

## 2022-11-22 DIAGNOSIS — I5189 Other ill-defined heart diseases: Secondary | ICD-10-CM | POA: Diagnosis not present

## 2022-11-22 DIAGNOSIS — I509 Heart failure, unspecified: Secondary | ICD-10-CM | POA: Diagnosis not present

## 2022-11-27 DIAGNOSIS — I2510 Atherosclerotic heart disease of native coronary artery without angina pectoris: Secondary | ICD-10-CM | POA: Diagnosis not present

## 2022-11-27 DIAGNOSIS — I509 Heart failure, unspecified: Secondary | ICD-10-CM | POA: Diagnosis not present

## 2022-11-27 DIAGNOSIS — E785 Hyperlipidemia, unspecified: Secondary | ICD-10-CM | POA: Diagnosis not present

## 2022-11-27 DIAGNOSIS — I10 Essential (primary) hypertension: Secondary | ICD-10-CM | POA: Diagnosis not present

## 2022-11-30 DIAGNOSIS — J449 Chronic obstructive pulmonary disease, unspecified: Secondary | ICD-10-CM | POA: Diagnosis not present
